# Patient Record
Sex: Female | Born: 1970
Health system: Southern US, Community
[De-identification: ages and names within clinical notes are randomized; demographics above are authoritative.]

---

## 2018-11-15 ENCOUNTER — Ambulatory Visit: Payer: Self-pay | Admitting: Family Medicine

## 2018-11-15 ENCOUNTER — Ambulatory Visit (INDEPENDENT_AMBULATORY_CARE_PROVIDER_SITE_OTHER): Payer: BLUE CROSS/BLUE SHIELD | Admitting: Family Medicine

## 2018-11-15 ENCOUNTER — Ambulatory Visit: Payer: Self-pay

## 2018-11-15 ENCOUNTER — Encounter: Payer: Self-pay | Admitting: Family Medicine

## 2018-11-15 VITALS — BP 122/90 | HR 74 | Ht 66.0 in

## 2018-11-15 DIAGNOSIS — M25511 Pain in right shoulder: Secondary | ICD-10-CM

## 2018-11-15 DIAGNOSIS — G8929 Other chronic pain: Secondary | ICD-10-CM | POA: Diagnosis not present

## 2018-11-15 DIAGNOSIS — M75101 Unspecified rotator cuff tear or rupture of right shoulder, not specified as traumatic: Secondary | ICD-10-CM | POA: Insufficient documentation

## 2018-11-15 MED ORDER — NITROGLYCERIN 0.2 MG/HR TD PT24: MEDICATED_PATCH | TRANSDERMAL | 1 refills | Status: AC

## 2018-11-15 MED ORDER — DICLOFENAC SODIUM 2 % TD SOLN: 2.0000 g | Freq: Two times a day (BID) | TRANSDERMAL | 3 refills | Status: AC

## 2018-11-15 MED ORDER — VITAMIN D (ERGOCALCIFEROL) 1.25 MG (50000 UNIT) PO CAPS: 50000.0000 [IU] | ORAL_CAPSULE | ORAL | 0 refills | Status: AC

## 2018-11-15 NOTE — Assessment & Plan Note (Signed)
Rotator cuff tear, partial, there is a possibility for also an frozen shoulder.  I discussed with patient in great length.  I do not believe that this is cervical radiculopathy.  Nitroglycerin and discussed potential side effects.  Discussed once weekly vitamin D, icing regimen, topical anti-inflammatories.  Follow-up again 4 weeks.  Patient was to give injection if worsening and consider physical therapy.

## 2018-11-15 NOTE — Progress Notes (Deleted)
[name] [address] [place], [place] [address] Phone: [phone] Subjective:    I'm seeing this patient by the request  of:    CC:   [id number]  Sandy Jordan is a 48 y.o. female coming in with complaint of ***  Onset-  Location Duration-  Character- Aggravating factors- Reliving factors-  Therapies tried-  Severity-     No past medical history on file. *** The histories are not reviewed yet. Please review them in the "History" navigator section and refresh this SmartLink. Social History   Socioeconomic History  . Marital status: Married    Spouse name: Not on file  . Number of children: Not on file  . Years of education: Not on file  . Highest education level: Not on file  Occupational History  . Not on file  Social Needs  . Financial resource strain: Not on file  . Food insecurity:    Worry: Not on file    Inability: Not on file  . Transportation needs:    Medical: Not on file    Non-medical: Not on file  Tobacco Use  . Smoking status: Not on file  Substance and Sexual Activity  . Alcohol use: Not on file  . Drug use: Not on file  . Sexual activity: Not on file  Lifestyle  . Physical activity:    Days per week: Not on file    Minutes per session: Not on file  . Stress: Not on file  Relationships  . Social connections:    Talks on phone: Not on file    Gets together: Not on file    Attends religious service: Not on file    Active member of club or organization: Not on file    Attends meetings of clubs or organizations: Not on file    Relationship status: Not on file  Other Topics Concern  . Not on file  Social History Narrative  . Not on file   Allergies not on file No family history on file. No current outpatient medications on file.    Past medical history, social, surgical and family history all reviewed in electronic medical record.  No pertanent information unless stated regarding to the chief  complaint.   Review of Systems:  No headache, visual changes, nausea, vomiting, diarrhea, constipation, dizziness, abdominal pain, skin rash, fevers, chills, night sweats, weight loss, swollen lymph nodes, body aches, joint swelling, muscle aches, chest pain, shortness of breath, mood changes.   Objective  There were no vitals taken for this visit. Systems examined below as of    General: No apparent distress alert and oriented x3 mood and affect normal, dressed appropriately.  HEENT: Pupils equal, extraocular movements intact  Respiratory: Patient's speak in full sentences and does not appear short of breath  Cardiovascular: No lower extremity edema, non tender, no erythema  Skin: Warm dry intact with no signs of infection or rash on extremities or on axial skeleton.  Abdomen: Soft nontender  Neuro: Cranial nerves II through XII are intact, neurovascularly intact in all extremities with 2+ DTRs and 2+ pulses.  Lymph: No lymphadenopathy of posterior or anterior cervical chain or axillae bilaterally.  Gait normal with good balance and coordination.  MSK:  Non tender with full range of motion and good stability and symmetric strength and tone of  elbows, wrist, hip, knee and ankles bilaterally.  Shoulder: Right Inspection reveals no abnormalities, atrophy or asymmetry. Palpation is normal with no tenderness over  AC joint or bicipital groove. ROM is full in all planes passively. Rotator cuff strength normal throughout. signs of impingement with positive Neer and Hawkin's tests, but negative empty can sign. Speeds and Yergason's tests normal. No labral pathology noted with negative Obrien's, negative clunk and good stability. Normal scapular function observed. No painful arc and no drop arm sign. No apprehension sign  MSK US performed of: Right This study was ordered, performed, and interpreted by Terrilee Files D.O.  Shoulder:   Supraspinatus:  Appears normal on long and transverse views,  Bursal bulge seen with shoulder abduction on impingement view. Infraspinatus:  Appears normal on long and transverse views. Significant increase in Doppler flow Subscapularis:  Appears normal on long and transverse views. Positive bursa Teres Minor:  Appears normal on long and transverse views. AC joint:  Capsule undistended, no geyser sign. Glenohumeral Joint:  Appears normal without effusion. Glenoid Labrum:  Intact without visualized tears. Biceps Tendon:  Appears normal on long and transverse views, no fraying of tendon, tendon located in intertubercular groove, no subluxation with shoulder internal or external rotation.  Impression: Subacromial bursitis  Procedure: Real-time Ultrasound Guided Injection of right glenohumeral joint Device: GE Logiq E  Ultrasound guided injection is preferred based studies that show increased duration, increased effect, greater accuracy, decreased procedural pain, increased response rate with ultrasound guided versus blind injection.  Verbal informed consent obtained.  Time-out conducted.  Noted no overlying erythema, induration, or other signs of local infection.  Skin prepped in a sterile fashion.  Local anesthesia: Topical Ethyl chloride.  With sterile technique and under real time ultrasound guidance:  Joint visualized.  23g 1  inch needle inserted posterior approach. Pictures taken for needle placement. Patient did have injection of 2 cc of 1% lidocaine, 2 cc of 0.5% Marcaine, and 1.0 cc of Kenalog 40 mg/dL. Completed without difficulty  Pain immediately resolved suggesting accurate placement of the medication.  Advised to call if fevers/chills, erythema, induration, drainage, or persistent bleeding.  Images permanently stored and available for review in the ultrasound unit.  Impression: Technically successful ultrasound guided injection.    Impression and Recommendations:     This case required medical decision making of moderate  complexity. The above documentation has been reviewed and is accurate and complete Judi Saa, DO       Note: This dictation was prepared with Dragon dictation along with smaller phrase technology. Any transcriptional errors that result from this process are unintentional.

## 2018-11-15 NOTE — Patient Instructions (Addendum)
Good to see you.  Ice 20 minutes 2 times daily. Usually after activity and before bed. pennsaid pinkie amount topically 2 times daily as needed.  Nitroglycerin Protocol   Apply 1/4 nitroglycerin patch to affected area daily.  Change position of patch within the affected area every 24 hours.  You may experience a headache during the first 1-2 weeks of using the patch, these should subside.  If you experience headaches after beginning nitroglycerin patch treatment, you may take your preferred over the counter pain reliever.  Another side effect of the nitroglycerin patch is skin irritation or rash related to patch adhesive.  Please notify our office if you develop more severe headaches or rash, and stop the patch.  Tendon healing with nitroglycerin patch may require 12 to 24 weeks depending on the extent of injury.  Men should not use if taking Viagra, Cialis, or Levitra.   Do not use if you have migraines or rosacea.  Once weekly vitamin D for 12 weeks Avoid heavy lifting and try to keep hands within peripheral vision   See me again in 3-5 weeks and if not better we will discuss injections or PT

## 2018-11-15 NOTE — Progress Notes (Signed)
[facility] [address] [place], [place] [address] Phone: [phone] Subjective:    Scribed by Wilford Grist  CC: right shoulder pain   HUT:[id number]  Sandy Jordan is a 48 y.o. female coming in with complaint of right shoulder pain for 4 months ago. Believes she may have injured it doing [employer]. Has taken a couple weeks off. Pain has gotten worse. Pain with side planks and tricep work. Pain is over supraspinatus but can go into the right arm. Pain is achy in character. Has used heat and Advil.  Does not remember any specific     History reviewed. No pertinent past medical history. History reviewed. No pertinent surgical history. Social History   Socioeconomic History  . Marital status: Divorced    Spouse name: Not on file  . Number of children: Not on file  . Years of education: Not on file  . Highest education level: Not on file  Occupational History  . Not on file  Social Needs  . Financial resource strain: Not on file  . Food insecurity:    Worry: Not on file    Inability: Not on file  . Transportation needs:    Medical: Not on file    Non-medical: Not on file  Tobacco Use  . Smoking status: Not on file  Substance and Sexual Activity  . Alcohol use: Not on file  . Drug use: Not on file  . Sexual activity: Not on file  Lifestyle  . Physical activity:    Days per week: Not on file    Minutes per session: Not on file  . Stress: Not on file  Relationships  . Social connections:    Talks on phone: Not on file    Gets together: Not on file    Attends religious service: Not on file    Active member of club or organization: Not on file    Attends meetings of clubs or organizations: Not on file    Relationship status: Not on file  Other Topics Concern  . Not on file  Social History Narrative  . Not on file   Not on File History reviewed. No pertinent family history.   Current Outpatient Medications (Cardiovascular):    .  nitroGLYCERIN (NITRODUR - DOSED IN MG/24 HR) 0.2 mg/hr patch, 1/4 patch daily     Current Outpatient Medications (Other):  [name]  Diclofenac Sodium 2 % SOLN, Place 2 g onto the skin 2 (two) times daily. .  Vitamin D, Ergocalciferol, (DRISDOL) 1.25 MG (50000 UT) CAPS capsule, Take 1 capsule (50,000 Units total) by mouth every 7 (seven) days.    Past medical history, social, surgical and family history all reviewed in electronic medical record.  No pertanent information unless stated regarding to the chief complaint.   Review of Systems:  No headache, visual changes, nausea, vomiting, diarrhea, constipation, dizziness, abdominal pain, skin rash, fevers, chills, night sweats, weight loss, swollen lymph nodes, body aches, joint swelling,  chest pain, shortness of breath, mood changes. Positive muscle aches   Objective  Blood pressure 122/90, pulse 74, height 5\' 6"  (1.676 m), SpO2 98 %.    General: No apparent distress alert and oriented x3 mood and affect normal, dressed appropriately.  HEENT: Pupils equal, extraocular movements intact  Respiratory: Patient's speak in full sentences and does not appear short of breath  Cardiovascular: No lower extremity edema, non tender, no erythema  Skin: Warm dry intact with no signs of infection or  rash on extremities or on axial skeleton.  Abdomen: Soft nontender  Neuro: Cranial nerves II through XII are intact, neurovascularly intact in all extremities with 2+ DTRs and 2+ pulses.  Lymph: No lymphadenopathy of posterior or anterior cervical chain or axillae bilaterally.  Gait normal with good balance and coordination.  MSK:  Non tender with full range of motion and good stability and symmetric strength and tone of  elbows, wrist, hip, knee and ankles bilaterally.  \ Shoulder: Right Inspection reveals no abnormalities, atrophy or asymmetry. Palpation is normal with no tenderness over AC joint or bicipital groove. ROM is decreased in all planes.   Forward flexion of 170 degrees, external rotation 75 degrees, internal rotation to sacrum. Rotator cuff strength 5-5 strength Positive impingement Speeds and Yergason's tests normal. Mild positive O'Brien's Normal scapular function observed. No painful arc and no drop arm sign. No apprehension sign Contralateral shoulder unremarkable  MSK US performed of: *Right shoulder This study was ordered, performed, and interpreted by Terrilee Files D.O.  Shoulder:   Supraspinatus: Patient does have some what appears to be articular sided tearing but no significant retraction noted.  Mild increase in hypoechoic changes as well as Doppler flow continue the anterior capsule noted Infraspinatus:  Appears normal on long and transverse views. Subscapularis:  Appears normal on long and transverse views. AC joint:  Capsule undistended, no geyser sign. Glenohumeral Joint:  Appears normal without effusion. Glenoid Labrum:  Intact without visualized tears. Biceps Tendon:  Appears normal on long and transverse views, no fraying of tendon, tendon located in intertubercular groove, no subluxation with shoulder internal or external rotation. No increased power doppler signal. Impression: Rotator cuff tear and partial as well as some thickening of the capsule  [id number]; 15 additional minutes spent for Therapeutic exercises as stated in above notes.  This included exercises focusing on stretching, strengthening, with significant focus on eccentric aspects.   Long term goals include an improvement in range of motion, strength, endurance as well as avoiding reinjury. Patient's frequency would include in 1-2 times a day, 3-5 times a week for a duration of 6-12 weeks. Shoulder Exercises that included:  Basic scapular stabilization to include adduction and depression of scapula Scaption, focusing on proper movement and good control Internal and External rotation utilizing a theraband, with elbow tucked at side entire time Rows  with theraband which was given   Proper technique shown and discussed handout in great detail with ATC.  All questions were discussed and answered.      Impression and Recommendations:     This case required medical decision making of moderate complexity. The above documentation has been reviewed and is accurate and complete Judi Saa, DO       Note: This dictation was prepared with Dragon dictation along with smaller phrase technology. Any transcriptional errors that result from this process are unintentional.

## 2018-12-13 ENCOUNTER — Ambulatory Visit (INDEPENDENT_AMBULATORY_CARE_PROVIDER_SITE_OTHER): Payer: BLUE CROSS/BLUE SHIELD | Admitting: Family Medicine

## 2018-12-13 ENCOUNTER — Encounter: Payer: Self-pay | Admitting: Family Medicine

## 2018-12-13 ENCOUNTER — Other Ambulatory Visit: Payer: Self-pay

## 2018-12-13 ENCOUNTER — Ambulatory Visit: Payer: Self-pay

## 2018-12-13 VITALS — BP 122/82 | HR 77 | Ht 66.0 in

## 2018-12-13 DIAGNOSIS — G8929 Other chronic pain: Secondary | ICD-10-CM

## 2018-12-13 DIAGNOSIS — M25511 Pain in right shoulder: Secondary | ICD-10-CM

## 2018-12-13 DIAGNOSIS — M7501 Adhesive capsulitis of right shoulder: Secondary | ICD-10-CM

## 2018-12-13 DIAGNOSIS — M7500 Adhesive capsulitis of unspecified shoulder: Secondary | ICD-10-CM | POA: Insufficient documentation

## 2018-12-13 NOTE — Assessment & Plan Note (Signed)
Patient given injection and tolerated the procedure well.  Discussed icing regimen and home exercise.  Discussed which activities to do which wants to avoid.  Increase activity slowly over the course of next several days.  Follow-up again 6 weeks.  May need repeat injection again.  If no significant improvement may need to consider advanced imaging.

## 2018-12-13 NOTE — Progress Notes (Signed)
[facility] [address] [place], [place] [address] Phone: [phone] Subjective:   Sandy Jordan, am serving as a scribe for Dr. Antoine Primas.   CC: Shoulder pain follow-up  XQJ:[id number]   11/16/2018: Rotator cuff tear, partial, there is a possibility for also an frozen shoulder.  I discussed with patient in great length.  I do not believe that this is cervical radiculopathy.  Nitroglycerin and discussed potential side effects.  Discussed once weekly vitamin D, icing regimen, topical anti-inflammatories.  Follow-up again 4 weeks.  Patient was to give injection if worsening and consider physical therapy.  Update 12/13/2018: Sandy Jordan is a 48 y.o. female coming in with complaint of right shoulder pain. Patient states that she is no better than last visit. Continues to have catching sensation which produces sharp pain. Has been using nitro patches and vitamin d.        No past medical history on file. No past surgical history on file. Social History   Socioeconomic History  . Marital status: Divorced    Spouse name: Not on file  . Number of children: Not on file  . Years of education: Not on file  . Highest education level: Not on file  Occupational History  . Not on file  Social Needs  . Financial resource strain: Not on file  . Food insecurity:    Worry: Not on file    Inability: Not on file  . Transportation needs:    Medical: Not on file    Non-medical: Not on file  Tobacco Use  . Smoking status: Not on file  Substance and Sexual Activity  . Alcohol use: Not on file  . Drug use: Not on file  . Sexual activity: Not on file  Lifestyle  . Physical activity:    Days per week: Not on file    Minutes per session: Not on file  . Stress: Not on file  Relationships  . Social connections:    Talks on phone: Not on file    Gets together: Not on file    Attends religious service: Not on file    Active member of club or  organization: Not on file    Attends meetings of clubs or organizations: Not on file    Relationship status: Not on file  Other Topics Concern  . Not on file  Social History Narrative  . Not on file   Not on File No family history on file.   Current Outpatient Medications (Cardiovascular):  .  nitroGLYCERIN (NITRODUR - DOSED IN MG/24 HR) 0.2 mg/hr patch, 1/4 patch daily     Current Outpatient Medications (Other):  [name]  Diclofenac Sodium 2 % SOLN, Place 2 g onto the skin 2 (two) times daily. .  Vitamin D, Ergocalciferol, (DRISDOL) 1.25 MG (50000 UT) CAPS capsule, Take 1 capsule (50,000 Units total) by mouth every 7 (seven) days.    Past medical history, social, surgical and family history all reviewed in electronic medical record.  No pertanent information unless stated regarding to the chief complaint.   Review of Systems:  No headache, visual changes, nausea, vomiting, diarrhea, constipation, dizziness, abdominal pain, skin rash, fevers, chills, night sweats, weight loss, swollen lymph nodes, body aches, joint swelling, chest pain, shortness of breath, mood changes.  Positive muscle aches  Objective  Blood pressure 122/82, pulse 77, height 5\' 6"  (1.676 m), SpO2 99 %.    General: No apparent distress alert and oriented x3 mood and  affect normal, dressed appropriately.  HEENT: Pupils equal, extraocular movements intact  Respiratory: Patient's speak in full sentences and does not appear short of breath  Cardiovascular: No lower extremity edema, non tender, no erythema  Skin: Warm dry intact with no signs of infection or rash on extremities or on axial skeleton.  Abdomen: Soft nontender  Neuro: Cranial nerves II through XII are intact, neurovascularly intact in all extremities with 2+ DTRs and 2+ pulses.  Lymph: No lymphadenopathy of posterior or anterior cervical chain or axillae bilaterally.  Gait normal with good balance and coordination.  MSK:  Non tender with full range of  motion and good stability and symmetric strength and tone of  elbows, wrist, hip, knee and ankles bilaterally.  Vaginal exam has decreased range of motion in all planes.  Patient does have some mild crepitus noted.  Patient does have some tenderness to palpation.  Patient has been only forward flexion of 120 degrees, external rotation of 5 degrees and internal rotation to lateral hip Contralateral shoulder unremarkable  Procedure: Real-time Ultrasound Guided Injection of right glenohumeral joint Device: GE Logiq Q7  Ultrasound guided injection is preferred based studies that show increased duration, increased effect, greater accuracy, decreased procedural pain, increased response rate with ultrasound guided versus blind injection.  Verbal informed consent obtained.  Time-out conducted.  Noted no overlying erythema, induration, or other signs of local infection.  Skin prepped in a sterile fashion.  Local anesthesia: Topical Ethyl chloride.  With sterile technique and under real time ultrasound guidance:  Joint visualized.  23g 1  inch needle inserted posterior approach. Pictures taken for needle placement. Patient did have injection of 2 cc of 1% lidocaine, 2 cc of 0.5% Marcaine, and 1.0 cc of Kenalog 40 mg/dL. Completed without difficulty  Pain immediately resolved suggesting accurate placement of the medication.  Advised to call if fevers/chills, erythema, induration, drainage, or persistent bleeding.  Images permanently stored and available for review in the ultrasound unit.  Impression: Technically successful ultrasound guided injection.    Impression and Recommendations:     This case required medical decision making of moderate complexity. The above documentation has been reviewed and is accurate and complete Judi Saa, DO       Note: This dictation was prepared with Dragon dictation along with smaller phrase technology. Any transcriptional errors that result from this  process are unintentional.

## 2018-12-13 NOTE — Patient Instructions (Addendum)
Good to see you  Ice is your friend Keep ding the exercises  See me again in 6ish weeks and may need to repeat the injection  Stay safe

## 2019-01-24 ENCOUNTER — Ambulatory Visit (INDEPENDENT_AMBULATORY_CARE_PROVIDER_SITE_OTHER): Payer: BLUE CROSS/BLUE SHIELD | Admitting: Family Medicine

## 2019-01-24 ENCOUNTER — Encounter: Payer: Self-pay | Admitting: Family Medicine

## 2019-01-24 ENCOUNTER — Other Ambulatory Visit: Payer: Self-pay

## 2019-01-24 ENCOUNTER — Ambulatory Visit (INDEPENDENT_AMBULATORY_CARE_PROVIDER_SITE_OTHER)
Admission: RE | Admit: 2019-01-24 | Discharge: 2019-01-24 | Disposition: A | Discharge status: Home / Self Care | Payer: BLUE CROSS/BLUE SHIELD | Source: Ambulatory Visit | Attending: Family Medicine | Admitting: Family Medicine

## 2019-01-24 ENCOUNTER — Ambulatory Visit: Payer: Self-pay

## 2019-01-24 VITALS — BP 136/80 | HR 87 | Ht 66.0 in | Wt 160.0 lb

## 2019-01-24 DIAGNOSIS — G8929 Other chronic pain: Secondary | ICD-10-CM | POA: Diagnosis not present

## 2019-01-24 DIAGNOSIS — M25511 Pain in right shoulder: Secondary | ICD-10-CM | POA: Diagnosis not present

## 2019-01-24 DIAGNOSIS — M7501 Adhesive capsulitis of right shoulder: Secondary | ICD-10-CM | POA: Diagnosis not present

## 2019-01-24 MED ORDER — GABAPENTIN 100 MG PO CAPS: 200.0000 mg | ORAL_CAPSULE | Freq: Every day | ORAL | 3 refills | Status: AC

## 2019-01-24 NOTE — Assessment & Plan Note (Signed)
Still believe likely secondary to frozen shoulder.  6 weeks after the injection.  Pain is better controlled but no improvement in range of motion.  We will start formal physical therapy that I think will be beneficial.  X-rays ordered today to further evaluate secondary to patient's increasing muscle wasting recently.  Patient continued to have problems I would consider a MRI sooner than later.  Continue all other medications

## 2019-01-24 NOTE — Patient Instructions (Addendum)
Good to see you  Xray downstairs  Ice is your friend Keep working on the range of motion  PT will call you  Gabapentin at night See me again in 4ish weeks

## 2019-01-24 NOTE — Progress Notes (Signed)
[name] [address] [place], [place] [address] Phone: [phone] Subjective:   I [name] am serving as a Neurosurgeon for Dr. Antoine Primas.  I'm seeing this patient by the request  of:    CC:   [id number]   12/13/2018 Patient given injection and tolerated the procedure well.  Discussed icing regimen and home exercise.  Discussed which activities to do which wants to avoid.  Increase activity slowly over the course of next several days.  Follow-up again 6 weeks.  May need repeat injection again.  If no significant improvement may need to consider advanced imaging.  01/24/2019 Sandy Jordan is a 48 y.o. female coming in with complaint of shoulder pain. States that she feels about the same.  Patient had a small partial tear of the rotator cuff and then unfortunately did have what appeared to be frozen shoulder.  Patient has stated that she has noticed a little bit of increase in weakness.  States that the pain is though fairly controlled.  Patient is more concerned with loss of motion has not made any significant improvement at this time.    No past medical history on file. No past surgical history on file. Social History   Socioeconomic History  . Marital status: Divorced    Spouse name: Not on file  . Number of children: Not on file  . Years of education: Not on file  . Highest education level: Not on file  Occupational History  . Not on file  Social Needs  . Financial resource strain: Not on file  . Food insecurity:    Worry: Not on file    Inability: Not on file  . Transportation needs:    Medical: Not on file    Non-medical: Not on file  Tobacco Use  . Smoking status: Not on file  Substance and Sexual Activity  . Alcohol use: Not on file  . Drug use: Not on file  . Sexual activity: Not on file  Lifestyle  . Physical activity:    Days per week: Not on file    Minutes per session: Not on file  . Stress: Not on file   Relationships  . Social connections:    Talks on phone: Not on file    Gets together: Not on file    Attends religious service: Not on file    Active member of club or organization: Not on file    Attends meetings of clubs or organizations: Not on file    Relationship status: Not on file  Other Topics Concern  . Not on file  Social History Narrative  . Not on file   Not on File No family history on file.   Current Outpatient Medications (Cardiovascular):  .  nitroGLYCERIN (NITRODUR - DOSED IN MG/24 HR) 0.2 mg/hr patch, 1/4 patch daily     Current Outpatient Medications (Other):  [name]  Diclofenac Sodium 2 % SOLN, Place 2 g onto the skin 2 (two) times daily. .  Vitamin D, Ergocalciferol, (DRISDOL) 1.25 MG (50000 UT) CAPS capsule, Take 1 capsule (50,000 Units total) by mouth every 7 (seven) days. [name]  gabapentin (NEURONTIN) 100 MG capsule, Take 2 capsules (200 mg total) by mouth at bedtime.    Past medical history, social, surgical and family history all reviewed in electronic medical record.  No pertanent information unless stated regarding to the chief complaint.   Review of Systems:  No headache, visual changes, nausea, vomiting, diarrhea, constipation, dizziness,  abdominal pain, skin rash, fevers, chills, night sweats, weight loss, swollen lymph nodes, body aches, joint swelling, , chest pain, shortness of breath, mood changes.  Positive muscle aches  Objective  Blood pressure 136/80, pulse 87, height 5\' 6"  (1.676 m), weight 160 lb (72.6 kg), last menstrual period 01/02/2019, SpO2 98 %.    General: No apparent distress alert and oriented x3 mood and affect normal, dressed appropriately.  HEENT: Pupils equal, extraocular movements intact  Respiratory: Patient's speak in full sentences and does not appear short of breath  Cardiovascular: No lower extremity edema, non tender, no erythema  Skin: Warm dry intact with no signs of infection or rash on extremities or on axial  skeleton.  Abdomen: Soft nontender  Neuro: Cranial nerves II through XII are intact, neurovascularly intact in all extremities with 2+ DTRs and 2+ pulses.  Lymph: No lymphadenopathy of posterior or anterior cervical chain or axillae bilaterally.  Gait normal with good balance and coordination.  MSK:  Non tender with full range of motion and good stability and symmetric strength and tone of  elbows, wrist, hip, knee and ankles bilaterally.  Right shoulder exam shows no significant loss of range of motion.  Patient's inspection also shows the patient does have atrophy of the shoulder.  Patient has only forward flexion of 85 degrees, external rotation of 5 degrees, internal rotation to sacrum.  Rotator cuff strength 4 out of 5 compared to contralateral side  Limited musculoskeletal ultrasound was performed and interpreted by Judi SaaZachary M Lavita Pontius  Limited ultrasound shows the patient does have mild arthritic changes and patient does have significant inflammation of the anterior capsule noted.  no true rotator cuff tear noted at this time    Impression and Recommendations:     This case required medical decision making of moderate complexity. The above documentation has been reviewed and is accurate and complete Judi SaaZachary M Tylor Courtwright, DO       Note: This dictation was prepared with Dragon dictation along with smaller phrase technology. Any transcriptional errors that result from this process are unintentional.

## 2019-01-30 ENCOUNTER — Other Ambulatory Visit: Payer: Self-pay

## 2019-01-30 ENCOUNTER — Ambulatory Visit (INDEPENDENT_AMBULATORY_CARE_PROVIDER_SITE_OTHER): Payer: BLUE CROSS/BLUE SHIELD | Admitting: Physical Therapy

## 2019-01-30 DIAGNOSIS — M25611 Stiffness of right shoulder, not elsewhere classified: Secondary | ICD-10-CM | POA: Diagnosis not present

## 2019-01-30 DIAGNOSIS — R293 Abnormal posture: Secondary | ICD-10-CM | POA: Diagnosis not present

## 2019-01-30 DIAGNOSIS — M6281 Muscle weakness (generalized): Secondary | ICD-10-CM

## 2019-01-30 DIAGNOSIS — M25511 Pain in right shoulder: Secondary | ICD-10-CM

## 2019-01-30 NOTE — Therapy (Signed)
[facility] [address] [place], [place], [address] Phone: [phone]   Fax:  [phone]  Physical Therapy Evaluation  Patient Details  Name: Sandy Jordan MRN: [id number] Date of Birth: 09-20-1970 Referring Provider (PT): Dr. Terrilee Files   Encounter Date: 01/30/2019  PT End of Session - 01/30/19 1541    Visit Number  1    Number of Visits  12    Date for PT Re-Evaluation  03/13/19    PT Start Time  1453    PT Stop Time  1530    PT Time Calculation (min)  37 min    Activity Tolerance  Patient tolerated treatment well    Behavior During Therapy  [facility] for tasks assessed/performed       History reviewed. No pertinent past medical history.  History reviewed. No pertinent surgical history.  There were no vitals filed for this visit.   Subjective Assessment - 01/30/19 1455    Subjective  Pt is a 48 y/o female who presents to OPPT for Rt shoulder pain x 6 months.  Pt reports pain has gotten progressively worse, and no known cause or injury.      Limitations  Lifting    Diagnostic tests  xrays: normal    Patient Stated Goals  improve shoulder mobility and pain    Currently in Pain?  Yes    Pain Score  3    up to 10/10, occasional 0/10   Pain Location  Shoulder    Pain Orientation  Right    Pain Descriptors / Indicators  Tiring;Dull;Aching;Sharp   muscle fatigue   Pain Type  Acute pain;Chronic pain    Pain Radiating Towards  RUE    Pain Onset  More than a month ago    Pain Frequency  Intermittent    Aggravating Factors   forward reaching, UB dressing    Pain Relieving Factors  stretching, meds, heat/ice         [facility] PT Assessment - 01/30/19 1459      Assessment   Medical Diagnosis  Rt shoulder pain    Referring Provider (PT)  Dr. Terrilee Files    Onset Date/Surgical Date  --   Jan 2020   Hand Dominance  Right    Next MD Visit  02/25/2019    Prior Therapy  none      Precautions   Precautions  None      Restrictions   Weight Bearing Restrictions  No      Balance Screen   Has the patient fallen in the past 6 months  No    Has the patient had a decrease in activity level because of a fear of falling?   No    Is the patient reluctant to leave their home because of a fear of falling?   No      Home Public house manager residence    Additional Comments  modified with UB dressing      Prior Function   Level of Independence  Independent    Vocation  Full time employment    Vocation Requirements  CPA/accounting: seated computer work    Leisure  running/walking 2-3x/wk; was at [employer] prior to [employer] - pain started there      Cognition   Overall Cognitive Status  Within Functional Limits for tasks assessed      Posture/Postural Control   Posture/Postural Control  Postural limitations    Postural Limitations  Rounded Shoulders;Forward  head      ROM / Strength   AROM / PROM / Strength  AROM;PROM;Strength      AROM   Overall AROM Comments  flex/abdct tested in sitting    AROM Assessment Site  Shoulder    Right/Left Shoulder  Right;Left    Right Shoulder Flexion  83 Degrees    Right Shoulder ABduction  75 Degrees    Right Shoulder Internal Rotation  --   FIR to Rt glute   Right Shoulder External Rotation  29 Degrees   FER difficult to Rt ear   Left Shoulder Flexion  167 Degrees    Left Shoulder ABduction  169 Degrees      PROM   PROM Assessment Site  Shoulder    Right/Left Shoulder  Right    Right Shoulder Flexion  99 Degrees    Right Shoulder ABduction  95 Degrees      Strength   Overall Strength Comments  Rt shoulder grossly 3-/5      Palpation   Palpation comment  pt tender to light palpation Rt shoulder                Objective measurements completed on examination: See above findings.      [facility] - 01/30/19 1459      Exercises   Exercises  Shoulder      Shoulder Exercises: Supine   External Rotation   AAROM;Right;5 reps   cane   Flexion  AAROM;Right;5 reps   cane     Shoulder Exercises: Standing   Internal Rotation  AAROM;Right;5 reps   cane   ABduction  AAROM;Right;5 reps   scaption     Shoulder Exercises: Pulleys   Flexion  2 minutes             PT Education - 01/30/19 1540    Education Details  HEP, pulleys    Person(s) Educated  Patient    Methods  Explanation;Demonstration;Handout    Comprehension  Returned demonstration;Verbalized understanding;Need further instruction          PT Long Term Goals - 01/30/19 1545      PT LONG TERM GOAL #1   Title  independent with HEP    Status  New    Target Date  03/13/19      PT LONG TERM GOAL #2   Title  Rt shoulder flexion and abduction AROM improved at least 15 degrees for improved function    Status  New    Target Date  03/13/19      PT LONG TERM GOAL #3   Title  report pain < 4/10 with activity and reaching for improved function    Status  New    Target Date  03/13/19      PT LONG TERM GOAL #4   Title  demonstrate Rt shoulder functional internal rotation to at least L1 for improved function and greater ease with dressing    Status  New    Target Date  03/13/19             Plan - 01/30/19 1541    Clinical Impression Statement  Pt is a 48 y/o female who presents to OPPT for Rt adhesive capsulitis.  Pt demonstrates postural abnormalities, decreased ROM and strength affecting functional mobility.  Pt will benefit from PT to address deficits listed.    Examination-Activity Limitations  Bathing;Hygiene/Grooming;Reach Overhead;Toileting;Dressing    Examination-Participation Restrictions  Cleaning;Meal Prep;Other   work   Company secretarytability/Clinical Decision  Making  Evolving/Moderate complexity    Clinical Decision Making  Low    Rehab Potential  Good    PT Frequency  2x / week    PT Duration  6 weeks    PT Treatment/Interventions  ADLs/Self Care Home Management;Cryotherapy;Ultrasound;Moist Heat;Iontophoresis  $RemoveBef xamethasone;Electrical Stimulation;Therapeutic exercise;Therapeutic activities;Patient/family education;Manual techniques;Dry needling;Passive range of motion;Taping    PT Next Visit Plan  review cane exercises; pulleys; add posture exercises, manual therapy    PT Home Exercise Plan  Access Code: [email or web address]    Consulted and Agree with Plan of Care  Patient       Patient will benefit from skilled therapeutic intervention in order to improve the following deficits and impairments:  Increased fascial restricitons, Pain, Postural dysfunction, Decreased strength, Decreased range of motion, Hypomobility  Visit Diagnosis: Stiffness of right shoulder, not elsewhere classified - Plan: PT plan of care cert/re-cert  Acute pain of right shoulder - Plan: PT plan of care cert/re-cert  Abnormal posture - Plan: PT plan of care cert/re-cert  Muscle weakness (generalized) - Plan: PT plan of care cert/re-cert     Problem List Patient Active Problem List   Diagnosis Date Noted  . Frozen shoulder 12/13/2018  . Rotator cuff tear, right 11/15/2018      Clarita Crane, PT, DPT 01/30/19 3:49 PM     [facility] [address] [place], [place], [address] Phone: [phone]   Fax:  [phone]  Name: Sandy Jordan MRN: [id number] Date of Birth: 1970/10/04

## 2019-01-30 NOTE — Patient Instructions (Signed)
Access Code: [email or web address]  URL: [email or web address]  Date: 01/30/2019  Prepared by: Moshe Cipro   Exercises  Supine Shoulder Flexion with Dowel - 10 reps - 1 sets - 2-3 sec hold - 2x daily - 7x weekly  Supine Shoulder External Rotation with Dowel - 10 reps - 1 sets - 2-3 sec hold - 2x daily - 7x weekly  Standing Bilateral Shoulder Internal Rotation AAROM with Dowel - 10 reps - 1 sets - 2-3 sec hold - 2x daily - 7x weekly  Shoulder Scaption AAROM with Dowel - 10 reps - 1 sets - 2-3 sec hold - 2x daily - 7x weekly

## 2019-02-06 ENCOUNTER — Other Ambulatory Visit: Payer: Self-pay

## 2019-02-06 ENCOUNTER — Ambulatory Visit (INDEPENDENT_AMBULATORY_CARE_PROVIDER_SITE_OTHER): Payer: BLUE CROSS/BLUE SHIELD | Admitting: Physical Therapy

## 2019-02-06 DIAGNOSIS — M25511 Pain in right shoulder: Secondary | ICD-10-CM | POA: Diagnosis not present

## 2019-02-06 DIAGNOSIS — M25611 Stiffness of right shoulder, not elsewhere classified: Secondary | ICD-10-CM

## 2019-02-06 DIAGNOSIS — R293 Abnormal posture: Secondary | ICD-10-CM | POA: Diagnosis not present

## 2019-02-06 DIAGNOSIS — M6281 Muscle weakness (generalized): Secondary | ICD-10-CM | POA: Diagnosis not present

## 2019-02-06 NOTE — Therapy (Signed)
[facility] [address] [place], [place], [address] Phone: [phone]   Fax:  [phone]  Physical Therapy Treatment  Patient Details  Name: Sandy Jordan MRN: [id number] Date of Birth: 04-26-1971 Referring Provider (PT): Dr. Charlann Boxer   Encounter Date: 02/06/2019  PT End of Session - 02/06/19 1551    Visit Number  2    Number of Visits  12    Date for PT Re-Evaluation  03/13/19    PT Start Time  1503    PT Stop Time  1546    PT Time Calculation (min)  43 min    Activity Tolerance  Patient tolerated treatment well    Behavior During Therapy  [facility] for tasks assessed/performed       History reviewed. No pertinent past medical history.  History reviewed. No pertinent surgical history.  There were no vitals filed for this visit.  Subjective Assessment - 02/06/19 1506    Subjective  has some good days and other days when shoulder is still very tight.  pain with movement, at rest pain is okay.    Patient Stated Goals  improve shoulder mobility and pain    Currently in Pain?  Yes    Pain Score  2     Pain Location  Shoulder    Pain Orientation  Right    Pain Descriptors / Indicators  Dull;Aching    Pain Type  Acute pain;Chronic pain    Pain Onset  More than a month ago    Pain Frequency  Intermittent    Aggravating Factors   forward reaching, UB dressing    Pain Relieving Factors  stretching, meds, heat/ice         [facility] PT Assessment - 02/06/19 1530      Assessment   Medical Diagnosis  Rt shoulder pain    Referring Provider (PT)  Dr. Charlann Boxer      AROM   Right Shoulder Flexion  107 Degrees    Right Shoulder External Rotation  27 Degrees                   [facility] Adult PT Treatment/Exercise - 02/06/19 1508      Shoulder Exercises: Supine   External Rotation  AAROM;Right;10 reps   cane   Flexion  AAROM;Right;10 reps   cane     Shoulder Exercises: Standing   Internal Rotation  AAROM;Right;10  reps   cane   ABduction  AAROM;Right;10 reps   scaption   Extension  Both;15 reps;Theraband    Theraband Level (Shoulder Extension)  Level 2 (Red)    Row  Both;15 reps;Theraband    Theraband Level (Shoulder Row)  Level 2 (Red)      Shoulder Exercises: Pulleys   Flexion  2 minutes    Scaption  2 minutes      Manual Therapy   Manual Therapy  Joint mobilization;Soft tissue mobilization;Passive ROM    Joint Mobilization  Rt shoulder grades 2-3 inf and A/P    Soft tissue mobilization  rt upper trap and deltoids    Passive ROM  Rt shoulder flexion, abduction/scaption, IR/ER             PT Education - 02/06/19 1551    Education Details  DN    Person(s) Educated  Patient    Methods  Handout;Explanation    Comprehension  Verbalized understanding          PT Long Term Goals - 01/30/19 1545  PT LONG TERM GOAL #1   Title  independent with HEP    Status  New    Target Date  03/13/19      PT LONG TERM GOAL #2   Title  Rt shoulder flexion and abduction AROM improved at least 15 degrees for improved function    Status  New    Target Date  03/13/19      PT LONG TERM GOAL #3   Title  report pain < 4/10 with activity and reaching for improved function    Status  New    Target Date  03/13/19      PT LONG TERM GOAL #4   Title  demonstrate Rt shoulder functional internal rotation to at least L1 for improved function and greater ease with dressing    Status  New    Target Date  03/13/19            Plan - 02/06/19 1551    Clinical Impression Statement  Pt with improved active Rt shoulder flexion today, with external rotation unchanged at this time.  Slowly progressing towards goals, with ROM goal partially met at this time (will address and measure abduction next visit).  Will continue to benefit from PT to maximize function.    Examination-Activity Limitations  Bathing;Hygiene/Grooming;Reach Overhead;Toileting;Dressing    Examination-Participation Restrictions   Cleaning;Meal Prep;Other   work   Merchant navy officer  Evolving/Moderate complexity    Rehab Potential  Good    PT Frequency  2x / week    PT Duration  6 weeks    PT Treatment/Interventions  ADLs/Self Care Home Management;Cryotherapy;Ultrasound;Moist Heat;Iontophoresis 74m/ml Dexamethasone;Electrical Stimulation;Therapeutic exercise;Therapeutic activities;Patient/family education;Manual techniques;Dry needling;Passive range of motion;Taping    PT Next Visit Plan  try heat before manual/stretching, continue with AAROM and PROM, manual therapy (consider DN to upper traps/deltoids/rotator cuff - may provide some pain control and decrease muscular tightness)    PT Home Exercise Plan  Access Code: [email or web address]   Consulted and Agree with Plan of Care  Patient       Patient will benefit from skilled therapeutic intervention in order to improve the following deficits and impairments:  Increased fascial restricitons, Pain, Postural dysfunction, Decreased strength, Decreased range of motion, Hypomobility  Visit Diagnosis: Stiffness of right shoulder, not elsewhere classified  Acute pain of right shoulder  Abnormal posture  Muscle weakness (generalized)     Problem List Patient Active Problem List   Diagnosis Date Noted  . Frozen shoulder 12/13/2018  . Rotator cuff tear, right 11/15/2018      SLaureen Abrahams PT, DPT 02/06/19 3:55 PM     [place] [address]Phone: [phone]  Fax:  [phone] Name: DBreckin ZafarMRN: [id number] of Birth: [birth year]

## 2019-02-11 ENCOUNTER — Other Ambulatory Visit: Payer: Self-pay

## 2019-02-11 ENCOUNTER — Encounter: Payer: Self-pay | Admitting: Physical Therapy

## 2019-02-11 ENCOUNTER — Ambulatory Visit (INDEPENDENT_AMBULATORY_CARE_PROVIDER_SITE_OTHER): Payer: BLUE CROSS/BLUE SHIELD | Admitting: Physical Therapy

## 2019-02-11 DIAGNOSIS — M6281 Muscle weakness (generalized): Secondary | ICD-10-CM

## 2019-02-11 DIAGNOSIS — M25511 Pain in right shoulder: Secondary | ICD-10-CM

## 2019-02-11 DIAGNOSIS — R293 Abnormal posture: Secondary | ICD-10-CM

## 2019-02-11 DIAGNOSIS — M25611 Stiffness of right shoulder, not elsewhere classified: Secondary | ICD-10-CM

## 2019-02-11 NOTE — Therapy (Signed)
[facility] [address] [place], [place], [address] Phone: [phone]   Fax:  [phone]  Physical Therapy Treatment  Patient Details  Name: Sandy SpiesDawn Roycroft MRN: [id number] Date of Birth: 10/07/1970 Referring Provider (PT): Dr. Terrilee FilesZach Smith   Encounter Date: 02/11/2019  PT End of Session - 02/11/19 1549    Visit Number  3    Number of Visits  12    Date for PT Re-Evaluation  03/13/19    PT Start Time  1500    PT Stop Time  1538    PT Time Calculation (min)  38 min    Activity Tolerance  Patient tolerated treatment well    Behavior During Therapy  [facility] for tasks assessed/performed       History reviewed. No pertinent past medical history.  History reviewed. No pertinent surgical history.  There were no vitals filed for this visit.  Subjective Assessment - 02/11/19 1459    Subjective  Pt states pain is variable, but motion seems to be improving.   (Pended)     Patient Stated Goals  improve shoulder mobility and pain  (Pended)     Currently in Pain?  Yes  (Pended)     Pain Score  4   (Pended)     Pain Location  Shoulder  (Pended)     Pain Orientation  Right  (Pended)     Pain Descriptors / Indicators  Aching  (Pended)     Pain Type  Acute pain  (Pended)     Pain Onset  More than a month ago  (Pended)     Pain Frequency  Intermittent  (Pended)                        [facility] Adult PT Treatment/Exercise - 02/11/19 1531      Bed Mobility   Bed Mobility  --      Shoulder Exercises: Supine   External Rotation  AAROM;Right;20 reps   cane   Flexion  AAROM;Right;15 reps   cane   Other Supine Exercises  Supine ER butterfly 10 sec x10;       Shoulder Exercises: Seated   Other Seated Exercises  UT stretch 30 sec x2 on R;       Shoulder Exercises: Standing   Internal Rotation  AAROM;Right;10 reps   Hands behind back   ABduction  --   scaption   Extension  --    Theraband Level (Shoulder Extension)  --    Row   Both;Theraband;20 reps    Theraband Level (Shoulder Row)  Level 2 (Red)    Other Standing Exercises  Post shoulder stretch 30 sec x3 on R;     Other Standing Exercises  Wall Slides 2 UE x15;       Shoulder Exercises: Pulleys   Flexion  2 minutes    Scaption  2 minutes      Shoulder Exercises: Stretch   Corner Stretch  20 seconds    Corner Stretch Limitations  in doorway/90 deg      Manual Therapy   Manual Therapy  Joint mobilization;Soft tissue mobilization;Passive ROM    Joint Mobilization  Rt shoulder grades 2-3 inf and A/P    Soft tissue mobilization  --    Passive ROM  R shoulder, all motions,  Manual stretch for Bil UT;  Subscap release (light)              PT Education -  02/11/19 1549    Education Details  HEP updated    Person(s) Educated  Patient    Methods  Explanation    Comprehension  Verbalized understanding          PT Long Term Goals - 01/30/19 1545      PT LONG TERM GOAL #1   Title  independent with HEP    Status  New    Target Date  03/13/19      PT LONG TERM GOAL #2   Title  Rt shoulder flexion and abduction AROM improved at least 15 degrees for improved function    Status  New    Target Date  03/13/19      PT LONG TERM GOAL #3   Title  report pain < 4/10 with activity and reaching for improved function    Status  New    Target Date  03/13/19      PT LONG TERM GOAL #4   Title  demonstrate Rt shoulder functional internal rotation to at least L1 for improved function and greater ease with dressing    Status  New    Target Date  03/13/19            Plan - 02/11/19 1550    Clinical Impression Statement  Pt with stiffness in all ranges, especially for IR/ER. Progressed stretches for improving ROM today, discussed increasing frequency of motion at home. Pt continues to be very limtied with PROm and AROM.     Examination-Activity Limitations  Bathing;Hygiene/Grooming;Reach Overhead;Toileting;Dressing    Examination-Participation  Restrictions  Cleaning;Meal Prep;Other   work   Conservation officer, historic buildings  Evolving/Moderate complexity    Rehab Potential  Good    PT Frequency  2x / week    PT Duration  6 weeks    PT Treatment/Interventions  ADLs/Self Care Home Management;Cryotherapy;Ultrasound;Moist Heat;Iontophoresis 4mg /ml Dexamethasone;Electrical Stimulation;Therapeutic exercise;Therapeutic activities;Patient/family education;Manual techniques;Dry needling;Passive range of motion;Taping    PT Next Visit Plan  try heat before manual/stretching, continue with AAROM and PROM, manual therapy (consider DN to upper traps/deltoids/rotator cuff - may provide some pain control and decrease muscular tightness)    PT Home Exercise Plan  Access Code: [email or web address]    Consulted and Agree with Plan of Care  Patient       Patient will benefit from skilled therapeutic intervention in order to improve the following deficits and impairments:  Increased fascial restricitons, Pain, Postural dysfunction, Decreased strength, Decreased range of motion, Hypomobility  Visit Diagnosis: Stiffness of right shoulder, not elsewhere classified  Acute pain of right shoulder  Abnormal posture  Muscle weakness (generalized)     Problem List Patient Active Problem List   Diagnosis Date Noted  . Frozen shoulder 12/13/2018  . Rotator cuff tear, right 11/15/2018   Sedalia Muta, PT, DPT 3:51 PM  02/11/19    [facility] [address] [place], [place], [address] Phone: [phone]   Fax:  [phone]  Name: Sandy Jordan MRN: [id number] Date of Birth: 30-Sep-1970

## 2019-02-14 ENCOUNTER — Other Ambulatory Visit: Payer: Self-pay

## 2019-02-14 ENCOUNTER — Ambulatory Visit (INDEPENDENT_AMBULATORY_CARE_PROVIDER_SITE_OTHER): Payer: BLUE CROSS/BLUE SHIELD | Admitting: Physical Therapy

## 2019-02-14 DIAGNOSIS — M25511 Pain in right shoulder: Secondary | ICD-10-CM

## 2019-02-14 DIAGNOSIS — M25611 Stiffness of right shoulder, not elsewhere classified: Secondary | ICD-10-CM

## 2019-02-17 ENCOUNTER — Encounter: Payer: Self-pay | Admitting: Physical Therapy

## 2019-02-17 NOTE — Therapy (Signed)
[facility] [address] [place], [place], [address] Phone: [phone]   Fax:  [phone]  Physical Therapy Treatment  Patient Details  Name: Sandy Jordan MRN: [id number] Date of Birth: 03/19/1971 Referring Provider (PT): Dr. Charlann Boxer   Encounter Date: 02/14/2019  PT End of Session - 02/17/19 1347    Visit Number  4    Number of Visits  12    Date for PT Re-Evaluation  03/13/19    PT Start Time  1500    PT Stop Time  1543    PT Time Calculation (min)  43 min    Activity Tolerance  Patient tolerated treatment well    Behavior During Therapy  [facility] for tasks assessed/performed       History reviewed. No pertinent past medical history.  History reviewed. No pertinent surgical history.  There were no vitals filed for this visit.  Subjective Assessment - 02/17/19 1346    Subjective  Pt states she was sore after last session for a day or so, does feel arm is moving better.     Currently in Pain?  Yes    Pain Score  4     Pain Location  Shoulder    Pain Orientation  Right    Pain Descriptors / Indicators  Aching    Pain Type  Acute pain    Pain Onset  More than a month ago    Pain Frequency  Intermittent         [facility] PT Assessment - 02/17/19 0001      AROM   Right Shoulder Flexion  110 Degrees    Right Shoulder ABduction  105 Degrees    Right Shoulder External Rotation  25 Degrees      PROM   Right Shoulder Flexion  130 Degrees                   [facility] Adult PT Treatment/Exercise - 02/17/19 0001      Shoulder Exercises: Supine   External Rotation  AAROM;Right;20 reps   cane   Flexion  AAROM;Right;15 reps   cane   Other Supine Exercises  Supine ER butterfly 10 sec x10;       Shoulder Exercises: Standing   Internal Rotation  AAROM;Right;10 reps   Hands behind back   Flexion  AROM;10 reps    ABduction  AROM;10 reps    Row  Both;Theraband;20 reps    Theraband Level (Shoulder Row)  Level 3  (Green)    Other Standing Exercises  Post shoulder stretch 30 sec x3 on R;     Other Standing Exercises  Wall Slides 1 UE x15;       Shoulder Exercises: Pulleys   Flexion  2 minutes    Scaption  2 minutes      Shoulder Exercises: Stretch   Corner Stretch  20 seconds    Corner Stretch Limitations  in doorway/90 deg      Manual Therapy   Manual Therapy  Joint mobilization;Soft tissue mobilization;Passive ROM    Joint Mobilization  Rt shoulder grades 2-3 inf and A/P    Passive ROM  R shoulder, all motions,  Manual stretch for Bil UT;                   PT Long Term Goals - 01/30/19 1545      PT LONG TERM GOAL #1   Title  independent with HEP    Status  New  Target Date  03/13/19      PT LONG TERM GOAL #2   Title  Rt shoulder flexion and abduction AROM improved at least 15 degrees for improved function    Status  New    Target Date  03/13/19      PT LONG TERM GOAL #3   Title  report pain < 4/10 with activity and reaching for improved function    Status  New    Target Date  03/13/19      PT LONG TERM GOAL #4   Title  demonstrate Rt shoulder functional internal rotation to at least L1 for improved function and greater ease with dressing    Status  New    Target Date  03/13/19            Plan - 02/17/19 1348    Clinical Impression Statement  Pt with mild improvements of PROM and AROM with measurements taken today. Discussed frequent, light ROM at home. Pt with significant guarding during PROM. Continues to be most limted for ER /IR and ABd.     Examination-Activity Limitations  Bathing;Hygiene/Grooming;Reach Overhead;Toileting;Dressing    Examination-Participation Restrictions  Cleaning;Meal Prep;Other   work   Conservation officer, historic buildingstability/Clinical Decision Making  Evolving/Moderate complexity    Rehab Potential  Good    PT Frequency  2x / week    PT Duration  6 weeks    PT Treatment/Interventions  ADLs/Self Care Home Management;Cryotherapy;Ultrasound;Moist  Heat;Iontophoresis 4mg /ml Dexamethasone;Electrical Stimulation;Therapeutic exercise;Therapeutic activities;Patient/family education;Manual techniques;Dry needling;Passive range of motion;Taping    PT Next Visit Plan  try heat before manual/stretching, continue with AAROM and PROM, manual therapy (consider DN to upper traps/deltoids/rotator cuff - may provide some pain control and decrease muscular tightness)    PT Home Exercise Plan  Access Code: [email or web address]    Consulted and Agree with Plan of Care  Patient       Patient will benefit from skilled therapeutic intervention in order to improve the following deficits and impairments:  Increased fascial restricitons, Pain, Postural dysfunction, Decreased strength, Decreased range of motion, Hypomobility  Visit Diagnosis: Stiffness of right shoulder, not elsewhere classified  Acute pain of right shoulder     Problem List Patient Active Problem List   Diagnosis Date Noted  . Frozen shoulder 12/13/2018  . Rotator cuff tear, right 11/15/2018    Sedalia MutaLauren Legacy Carrender, PT, DPT 1:55 PM  02/17/19    [facility] [address] [address] , [place], [address] Phone: [phone]   Fax:  [phone]  Name: Sandy Jordan MRN: [id number] Date of Birth: 10/05/1970

## 2019-02-18 ENCOUNTER — Encounter: Payer: BLUE CROSS/BLUE SHIELD | Admitting: Physical Therapy

## 2019-02-21 ENCOUNTER — Ambulatory Visit (INDEPENDENT_AMBULATORY_CARE_PROVIDER_SITE_OTHER): Payer: BLUE CROSS/BLUE SHIELD | Admitting: Physical Therapy

## 2019-02-21 ENCOUNTER — Encounter: Payer: Self-pay | Admitting: Physical Therapy

## 2019-02-21 ENCOUNTER — Other Ambulatory Visit: Payer: Self-pay

## 2019-02-21 DIAGNOSIS — M25511 Pain in right shoulder: Secondary | ICD-10-CM | POA: Diagnosis not present

## 2019-02-21 DIAGNOSIS — M25611 Stiffness of right shoulder, not elsewhere classified: Secondary | ICD-10-CM | POA: Diagnosis not present

## 2019-02-21 NOTE — Therapy (Signed)
[facility] [address] [place], [place], [address] Phone: [phone]   Fax:  [phone]  Physical Therapy Treatment  Patient Details  Name: Jalexus Brett MRN: [id number] Date of Birth: 18-Mar-1971 Referring Provider (PT): Dr. Charlann Boxer   Encounter Date: 02/21/2019  PT End of Session - 02/21/19 1548    Visit Number  5    Number of Visits  12    Date for PT Re-Evaluation  03/13/19    PT Start Time  1500    PT Stop Time  1545    PT Time Calculation (min)  45 min    Activity Tolerance  Patient tolerated treatment well    Behavior During Therapy  [facility] for tasks assessed/performed       History reviewed. No pertinent past medical history.  History reviewed. No pertinent surgical history.  There were no vitals filed for this visit.  Subjective Assessment - 02/21/19 1547    Subjective  Pt feels shoulder motion is improving.    Limitations  Lifting;Writing;House hold activities    Patient Stated Goals  Decreased pain, improved movement/reaching.    Currently in Pain?  Yes    Pain Location  Shoulder    Pain Orientation  Right    Pain Descriptors / Indicators  Aching    Pain Type  Acute pain    Pain Onset  More than a month ago    Pain Frequency  Intermittent                       [facility] Adult PT Treatment/Exercise - 02/21/19 1503      Shoulder Exercises: Supine   Protraction  15 reps    Protraction Limitations  SA punch with cane    External Rotation  AAROM;Right;20 reps   cane   Flexion  AAROM;Right;15 reps   cane   Other Supine Exercises  --      Shoulder Exercises: Standing   Internal Rotation  AAROM;Right;10 reps   Hands behind back   Flexion  AROM;10 reps    ABduction  AROM;10 reps    Row  Both;Theraband;20 reps    Theraband Level (Shoulder Row)  Level 3 (Green)    Other Standing Exercises  Post shoulder stretch 30 sec x3 on R;     Other Standing Exercises  Wall Slides 1 UE x15 each for  flexion and circles;       Shoulder Exercises: Pulleys   Flexion  2 minutes    Scaption  2 minutes      Shoulder Exercises: Stretch   Corner Stretch  20 seconds    Corner Stretch Limitations  in doorway/45 deg      Manual Therapy   Manual Therapy  Joint mobilization;Soft tissue mobilization;Passive ROM    Joint Mobilization  Rt shoulder grades 2-3 inf and A/P, scapular mobs    Passive ROM  R shoulder, all motions,  Subscap release,                   PT Long Term Goals - 01/30/19 1545      PT LONG TERM GOAL #1   Title  independent with HEP    Status  New    Target Date  03/13/19      PT LONG TERM GOAL #2   Title  Rt shoulder flexion and abduction AROM improved at least 15 degrees for improved function    Status  New    Target Date  03/13/19      PT LONG TERM GOAL #3   Title  report pain < 4/10 with activity and reaching for improved function    Status  New    Target Date  03/13/19      PT LONG TERM GOAL #4   Title  demonstrate Rt shoulder functional internal rotation to at least L1 for improved function and greater ease with dressing    Status  New    Target Date  03/13/19            Plan - 02/21/19 1550    Clinical Impression Statement  Pt showing mild ROM improvements each week. She has improving ability for AROM for elevation. She has been able to tolerate manual therapy well, but does have significant guarding. Pt to benefit from continued care.    Examination-Activity Limitations  Bathing;Hygiene/Grooming;Reach Overhead;Toileting;Dressing    Examination-Participation Restrictions  Cleaning;Meal Prep;Other   work   Conservation officer, historic buildingstability/Clinical Decision Making  Evolving/Moderate complexity    Rehab Potential  Good    PT Frequency  2x / week    PT Duration  6 weeks    PT Treatment/Interventions  ADLs/Self Care Home Management;Cryotherapy;Ultrasound;Moist Heat;Iontophoresis 4mg /ml Dexamethasone;Electrical Stimulation;Therapeutic exercise;Therapeutic  activities;Patient/family education;Manual techniques;Dry needling;Passive range of motion;Taping    PT Home Exercise Plan  Access Code: [email or web address]    Consulted and Agree with Plan of Care  Patient       Patient will benefit from skilled therapeutic intervention in order to improve the following deficits and impairments:  Increased fascial restricitons, Pain, Postural dysfunction, Decreased strength, Decreased range of motion, Hypomobility  Visit Diagnosis: Stiffness of right shoulder, not elsewhere classified  Acute pain of right shoulder     Problem List Patient Active Problem List   Diagnosis Date Noted  . Frozen shoulder 12/13/2018  . Rotator cuff tear, right 11/15/2018    Sedalia MutaLauren Findlay Dagher, PT, DPT 3:51 PM  02/21/19    [facility] [address] [place], [place], [address] Phone: [phone]   Fax:  [phone]  Name: Ronie SpiesDawn Nay MRN: [id number] Date of Birth: 07/02/1971

## 2019-02-25 ENCOUNTER — Ambulatory Visit (INDEPENDENT_AMBULATORY_CARE_PROVIDER_SITE_OTHER): Payer: BC Managed Care – PPO | Admitting: Physical Therapy

## 2019-02-25 ENCOUNTER — Encounter: Payer: Self-pay | Admitting: Physical Therapy

## 2019-02-25 ENCOUNTER — Encounter: Payer: BLUE CROSS/BLUE SHIELD | Admitting: Physical Therapy

## 2019-02-25 ENCOUNTER — Ambulatory Visit (INDEPENDENT_AMBULATORY_CARE_PROVIDER_SITE_OTHER): Payer: BC Managed Care – PPO | Admitting: Family Medicine

## 2019-02-25 ENCOUNTER — Encounter: Payer: Self-pay | Admitting: Family Medicine

## 2019-02-25 ENCOUNTER — Other Ambulatory Visit: Payer: Self-pay

## 2019-02-25 VITALS — BP 100/70 | HR 58 | Ht 66.0 in | Wt 165.0 lb

## 2019-02-25 DIAGNOSIS — M25611 Stiffness of right shoulder, not elsewhere classified: Secondary | ICD-10-CM

## 2019-02-25 DIAGNOSIS — G8929 Other chronic pain: Secondary | ICD-10-CM | POA: Diagnosis not present

## 2019-02-25 DIAGNOSIS — M7501 Adhesive capsulitis of right shoulder: Secondary | ICD-10-CM

## 2019-02-25 DIAGNOSIS — M75111 Incomplete rotator cuff tear or rupture of right shoulder, not specified as traumatic: Secondary | ICD-10-CM | POA: Diagnosis not present

## 2019-02-25 DIAGNOSIS — M25511 Pain in right shoulder: Secondary | ICD-10-CM

## 2019-02-25 NOTE — Therapy (Signed)
[facility] [address] [address] [place], [place], [address] Phone: [phone]   Fax:  [phone]  Physical Therapy Treatment  Patient Details  Name: Sandy SpiesDawn Jordan MRN: [id number] Date of Birth: 02/12/1971 Referring Provider (PT): Dr. Terrilee FilesZach Smith   Encounter Date: 02/25/2019  PT End of Session - 02/25/19 1304    Visit Number  6    Number of Visits  12    Date for PT Re-Evaluation  03/13/19    PT Start Time  1259    PT Stop Time  1345    PT Time Calculation (min)  46 min    Activity Tolerance  Patient tolerated treatment well    Behavior During Therapy  [facility] for tasks assessed/performed       History reviewed. No pertinent past medical history.  History reviewed. No pertinent surgical history.  There were no vitals filed for this visit.  Subjective Assessment - 02/25/19 1303    Subjective  Pt states variable soreness, some nighs is very sore when trying to sleep.    Currently in Pain?  Yes    Pain Location  Shoulder    Pain Orientation  Right    Pain Descriptors / Indicators  Aching    Pain Type  Acute pain    Pain Onset  More than a month ago    Pain Frequency  Intermittent         [facility] PT Assessment - 02/25/19 0001      PROM   Right Shoulder Flexion  130 Degrees    Right Shoulder Internal Rotation  50 Degrees    Right Shoulder External Rotation  45 Degrees                   [facility] Adult PT Treatment/Exercise - 02/25/19 1305      Shoulder Exercises: Supine   Protraction  --    Protraction Limitations  --    External Rotation  AAROM;Right;20 reps   cane   Flexion  AAROM;Right;15 reps   cane     Shoulder Exercises: Standing   Internal Rotation  AAROM;Right;10 reps   Hands behind back, and with stick    Flexion  --    ABduction  --    Row  --    Theraband Level (Shoulder Row)  --    Other Standing Exercises  --    Other Standing Exercises  Wall Slides 1 UE x15 each for flexion and circles;  wall  push ups x12;       Shoulder Exercises: Pulleys   Flexion  2 minutes    Scaption  2 minutes      Shoulder Exercises: Stretch   Corner Stretch  --    Corner Stretch Limitations  --      Manual Therapy   Manual Therapy  Joint mobilization;Soft tissue mobilization;Passive ROM    Joint Mobilization  Rt shoulder grades 2-3 inf and A/P, scapular mobs    Passive ROM  R shoulder, all motions,  Subscap release,                   PT Long Term Goals - 01/30/19 1545      PT LONG TERM GOAL #1   Title  independent with HEP    Status  New    Target Date  03/13/19      PT LONG TERM GOAL #2   Title  Rt shoulder flexion and abduction AROM improved at least 15 degrees for  improved function    Status  New    Target Date  03/13/19      PT LONG TERM GOAL #3   Title  report pain < 4/10 with activity and reaching for improved function    Status  New    Target Date  03/13/19      PT LONG TERM GOAL #4   Title  demonstrate Rt shoulder functional internal rotation to at least L1 for improved function and greater ease with dressing    Status  New    Target Date  03/13/19            Plan - 02/25/19 1350    Clinical Impression Statement  Pt showing ROM improvements for horiz Abd, and rotation today. Still with significant guarding and moderate soreness with PROM. Pt to benefit from continued care.    Examination-Activity Limitations  Bathing;Hygiene/Grooming;Reach Overhead;Toileting;Dressing    Examination-Participation Restrictions  Cleaning;Meal Prep;Other   work   Merchant navy officer  Evolving/Moderate complexity    Rehab Potential  Good    PT Frequency  2x / week    PT Duration  6 weeks    PT Treatment/Interventions  ADLs/Self Care Home Management;Cryotherapy;Ultrasound;Moist Heat;Iontophoresis 4mg /ml Dexamethasone;Electrical Stimulation;Therapeutic exercise;Therapeutic activities;Patient/family education;Manual techniques;Dry needling;Passive range of  motion;Taping    PT Home Exercise Plan  Access Code: [email or web address]    Consulted and Agree with Plan of Care  Patient       Patient will benefit from skilled therapeutic intervention in order to improve the following deficits and impairments:  Increased fascial restricitons, Pain, Postural dysfunction, Decreased strength, Decreased range of motion, Hypomobility  Visit Diagnosis: Stiffness of right shoulder, not elsewhere classified  Acute pain of right shoulder     Problem List Patient Active Problem List   Diagnosis Date Noted  . Frozen shoulder 12/13/2018  . Rotator cuff tear, right 11/15/2018    [name], PT, DPT 1:51 PM  02/25/19    [facility] [place], [place], [address] Phone: [phone]   Fax:  [phone]  Name: Sandy Jordan MRN: [id number] Date of Birth: 1971-06-14

## 2019-02-25 NOTE — Assessment & Plan Note (Signed)
Patient continues to have some difficulty.  Discussed which activities to do which will still avoid.  Discussed which activities that would be beneficial.  I do feel that an MRI with arthrogram is necessary with the longevity of this problem.  Patient is having increasing atrophy of the shoulder and concern there could be some possible nerve injury at this time or labral pathology that is contributing to the lack of motion.  Patient is in agreement the plan and depending on the MRI we will discuss further evaluation and treatment afterwards.

## 2019-02-25 NOTE — Patient Instructions (Addendum)
Good to see you  MR-arthrogram of the shoulder  Ice is your friend I will write you when I get the results and talk about the options.

## 2019-02-25 NOTE — Progress Notes (Signed)
[facility] [address] [place], [place] [address] Phone: [phone] Subjective:   I Sandy Jordan am serving as a Education administrator for Dr. Hulan Saas.    CC: Shoulder pain follow-up  [email or web address]  Sandy Jordan is a 48 y.o. female coming in with complaint of right shoulder pain. States that some days are good and some are bad. Seeing some improvement in PT. patient was found to have more of a partial rotator cuff tear that significantly previously.  Has had problems with the shoulder.  Has a previous was diagnosed with more of a frozen shoulder.  Starting to have increasing discomfort again.  Patient states that the range of motion has not improved.Sandy Jordan     Has been going to formal physical therapy for some time now.  No past medical history on file. No past surgical history on file. Social History   Socioeconomic History  . Marital status: Divorced    Spouse name: Not on file  . Number of children: Not on file  . Years of education: Not on file  . Highest education level: Not on file  Occupational History  . Not on file  Social Needs  . Financial resource strain: Not on file  . Food insecurity    Worry: Not on file    Inability: Not on file  . Transportation needs    Medical: Not on file    Non-medical: Not on file  Tobacco Use  . Smoking status: Not on file  Substance and Sexual Activity  . Alcohol use: Not on file  . Drug use: Not on file  . Sexual activity: Not on file  Lifestyle  . Physical activity    Days per week: Not on file    Minutes per session: Not on file  . Stress: Not on file  Relationships  . Social Herbalist on phone: Not on file    Gets together: Not on file    Attends religious service: Not on file    Active member of club or organization: Not on file    Attends meetings of clubs or organizations: Not on file    Relationship status: Not on file  Other Topics Concern  . Not on file  Social History  Narrative  . Not on file   Not on File No family history on file.   Current Outpatient Medications (Cardiovascular):  .  nitroGLYCERIN (NITRODUR - DOSED IN MG/24 HR) 0.2 mg/hr patch, 1/4 patch daily (Patient not taking: Reported on 01/30/2019)     Current Outpatient Medications (Other):  Sandy Jordan  Diclofenac Sodium 2 % SOLN, Place 2 g onto the skin 2 (two) times daily. Sandy Jordan  gabapentin (NEURONTIN) 100 MG capsule, Take 2 capsules (200 mg total) by mouth at bedtime. .  Vitamin D, Ergocalciferol, (DRISDOL) 1.25 MG (50000 UT) CAPS capsule, Take 1 capsule (50,000 Units total) by mouth every 7 (seven) days.    Past medical history, social, surgical and family history all reviewed in electronic medical record.  No pertanent information unless stated regarding to the chief complaint.   Review of Systems:  No headache, visual changes, nausea, vomiting, diarrhea, constipation, dizziness, abdominal pain, skin rash, fevers, chills, night sweats, weight loss, swollen lymph nodes, body aches, joint swelling, muscle aches, chest pain, shortness of breath, mood changes.   Objective  There were no vitals taken for this visit. Systems examined below as of    General: No apparent distress alert and oriented x3  mood and affect normal, dressed appropriately.  HEENT: Pupils equal, extraocular movements intact  Respiratory: Patient's speak in full sentences and does not appear short of breath  Cardiovascular: No lower extremity edema, non tender, no erythema  Skin: Warm dry intact with no signs of infection or rash on extremities or on axial skeleton.  Abdomen: Soft nontender  Neuro: Cranial nerves II through XII are intact, neurovascularly intact in all extremities with 2+ DTRs and 2+ pulses.  Lymph: No lymphadenopathy of posterior or anterior cervical chain or axillae bilaterally.  Gait normal with good balance and coordination.  MSK:  Non tender with full range of motion and good stability and symmetric  strength and tone of  elbows, wrist, hip, knee and ankles bilaterally.   Shoulder: Right shoulder Inspection reveals atrophy noted compared to contralateral side this is worse than previous exams Palpation shows tenderness to palpation of the shoulder as well ROM is decreased with 160 degrees of forward flexion, external rotation of 5 degrees and internal rotation to hip Rotator cuff strength 3+ out of 5 which is worse Positive impingement Speeds and Yergason's tests normal. Positive O'Brien's Normal scapular function observed. No painful arc and no drop arm sign. No apprehension sign Contralateral shoulder unremarkable   MSK US performed of: right This study was ordered, performed, and interpreted by Terrilee FilesZach Gayland Nicol D.O.  Shoulder:   Supraspinatus: Tearing noted but no significant retraction.  Subacromial bursitis noted  AC joint:  capsulitis noted Glenohumeral Joint: Some calcific changes which is a different than previous exam Glenoid Labrum:  Intact without visualized tears. Biceps Tendon:  Appears normal on long and transverse views, no fraying of tendon, tendon located in intertubercular groove, no subluxation with shoulder internal or external rotation. No increased power doppler signal. Impression: Frozen shoulder, calcific changes to the glenohumeral joint, possible rotator cuff tear   Impression and Recommendations:     This case required medical decision making of moderate complexity. The above documentation has been reviewed and is accurate and complete Judi SaaZachary M Laetitia Schnepf, DO       Note: This dictation was prepared with Dragon dictation along with smaller phrase technology. Any transcriptional errors that result from this process are unintentional.

## 2019-02-28 ENCOUNTER — Ambulatory Visit (INDEPENDENT_AMBULATORY_CARE_PROVIDER_SITE_OTHER): Payer: BC Managed Care – PPO | Admitting: Physical Therapy

## 2019-02-28 ENCOUNTER — Other Ambulatory Visit: Payer: Self-pay

## 2019-02-28 ENCOUNTER — Encounter: Payer: Self-pay | Admitting: Physical Therapy

## 2019-02-28 DIAGNOSIS — M25611 Stiffness of right shoulder, not elsewhere classified: Secondary | ICD-10-CM

## 2019-02-28 DIAGNOSIS — M25511 Pain in right shoulder: Secondary | ICD-10-CM | POA: Diagnosis not present

## 2019-02-28 NOTE — Therapy (Signed)
[facility] [address] [address] [place], [place], [address] Phone: [phone]   Fax:  [phone]  Physical Therapy Treatment  Patient Details  Name: Sandy SpiesDawn Yankowski MRN: [id number] Date of Birth: 06/20/1971 Referring Provider (PT): Dr. Terrilee FilesZach Smith   Encounter Date: 02/28/2019  PT End of Session - 02/28/19 1553    Visit Number  7    Number of Visits  12    Date for PT Re-Evaluation  03/13/19    PT Start Time  1505    PT Stop Time  1549    PT Time Calculation (min)  44 min    Activity Tolerance  Patient tolerated treatment well    Behavior During Therapy  [facility] for [facility] assessed/per[facility]       History reviewed. No pertinent past medical history.  History reviewed. No pertinent surgical history.  There were no vitals filed for this visit.  Subjective Assessment - 02/28/19 1552    Subjective  Pt sore today. She saw MD, will be having MRI , not scheduled yet    Limitations  Lifting;Writing;House hold activities    Patient Stated Goals  Decreased pain, improved movement/reaching.    Currently in Pain?  Yes    Pain Score  4     Pain Location  Shoulder    Pain Descriptors / Indicators  Aching    Pain Type  Acute pain    Pain Onset  More than a month ago    Pain Frequency  Intermittent                       [facility] Adult PT Treatment/Exercise - 02/28/19 1509      Shoulder Exercises: Supine   Protraction  20 reps    Horizontal ABduction  AROM;10 reps    External Rotation  AAROM;Right;20 reps   cane   External Rotation Limitations  AROM 2lb x20 supine     Flexion  AAROM;Right;15 reps   cane   Flexion Limitations  AROM x10 no weight, supine      Shoulder Exercises: Standing   Internal Rotation  AAROM;Right;10 reps   Hands behind back, and with stick    Flexion  AROM;10 reps    ABduction  AROM;10 reps    Row  Both;Theraband;20 reps    Theraband Level (Shoulder Row)  Level 3 (Green)    Other Standing Exercises   Post shoulder stretch 30 sec x3 on R;     Other Standing Exercises  Wall Slides 1 UE x15 each for flexion and circles;  wall push ups x12;       Shoulder Exercises: Pulleys   Flexion  2 minutes    Scaption  2 minutes      Shoulder Exercises: Stretch   Corner Stretch  20 seconds;3 reps    Corner Stretch Limitations  in doorway/90 and low      Manual Therapy   Manual Therapy  Joint mobilization;Soft tissue mobilization;Passive ROM    Joint Mobilization  Rt shoulder grades 2-3 inf and A/P,     Passive ROM  R shoulder, all motions,  Subscap release,                   PT Long Term Goals - 01/30/19 1545      PT LONG TERM GOAL #1   Title  independent with HEP    Status  New    Target Date  03/13/19      PT LONG  TERM GOAL #2   Title  Rt shoulder flexion and abduction AROM improved at least 15 degrees for improved function    Status  New    Target Date  03/13/19      PT LONG TERM GOAL #3   Title  report pain < 4/10 with activity and reaching for improved function    Status  New    Target Date  03/13/19      PT LONG TERM GOAL #4   Title  demonstrate Rt shoulder functional internal rotation to at least L1 for improved function and greater ease with dressing    Status  New    Target Date  03/13/19            Plan - 02/28/19 1554    Clinical Impression Statement  Pt with increased guarding today, due to pain. She is improving with ability for AROM, but continues to have significant joint stiffness that is limiting all ROM. Pt to benefit from continued care.    Examination-Activity Limitations  Bathing;Hygiene/Grooming;Reach Overhead;Toileting;Dressing    Examination-Participation Restrictions  Cleaning;Meal Prep;Other   work   Merchant navy officer  Evolving/Moderate complexity    Rehab Potential  Good    PT Frequency  2x / week    PT Duration  6 weeks    PT Treatment/Interventions  ADLs/Self Care Home Management;Cryotherapy;Ultrasound;Moist  Heat;Iontophoresis 4mg /ml Dexamethasone;Electrical Stimulation;Therapeutic exercise;Therapeutic activities;Patient/family education;Manual techniques;Dry needling;Passive range of motion;Taping    PT Home Exercise Plan  Access Code: [email or web address]    Consulted and Agree with Plan of Care  Patient       Patient will benefit from skilled therapeutic intervention in order to improve the following deficits and impairments:  Increased fascial restricitons, Pain, Postural dysfunction, Decreased strength, Decreased range of motion, Hypomobility  Visit Diagnosis: 1. Stiffness of right shoulder, not elsewhere classified   2. Acute pain of right shoulder        Problem List Patient Active Problem List   Diagnosis Date Noted  . Frozen shoulder 12/13/2018  . Rotator cuff tear, right 11/15/2018   [name], PT, DPT 3:55 PM  02/28/19    [place], [place], [address] Phone: [phone]   Fax:  [phone]  Name: Sandy Jordan MRN: [id number] Date of Birth: 1971-06-22

## 2019-03-07 ENCOUNTER — Ambulatory Visit (INDEPENDENT_AMBULATORY_CARE_PROVIDER_SITE_OTHER): Payer: BC Managed Care – PPO | Admitting: Physical Therapy

## 2019-03-07 ENCOUNTER — Other Ambulatory Visit: Payer: Self-pay

## 2019-03-07 DIAGNOSIS — M6281 Muscle weakness (generalized): Secondary | ICD-10-CM

## 2019-03-07 DIAGNOSIS — M25511 Pain in right shoulder: Secondary | ICD-10-CM

## 2019-03-07 DIAGNOSIS — R293 Abnormal posture: Secondary | ICD-10-CM | POA: Diagnosis not present

## 2019-03-07 DIAGNOSIS — M25611 Stiffness of right shoulder, not elsewhere classified: Secondary | ICD-10-CM

## 2019-03-11 ENCOUNTER — Encounter: Payer: Self-pay | Admitting: Physical Therapy

## 2019-03-11 ENCOUNTER — Ambulatory Visit (INDEPENDENT_AMBULATORY_CARE_PROVIDER_SITE_OTHER): Payer: BC Managed Care – PPO | Admitting: Physical Therapy

## 2019-03-11 ENCOUNTER — Other Ambulatory Visit: Payer: Self-pay

## 2019-03-11 DIAGNOSIS — M25611 Stiffness of right shoulder, not elsewhere classified: Secondary | ICD-10-CM

## 2019-03-11 DIAGNOSIS — M25511 Pain in right shoulder: Secondary | ICD-10-CM | POA: Diagnosis not present

## 2019-03-11 DIAGNOSIS — M6281 Muscle weakness (generalized): Secondary | ICD-10-CM

## 2019-03-11 DIAGNOSIS — R293 Abnormal posture: Secondary | ICD-10-CM | POA: Diagnosis not present

## 2019-03-11 NOTE — Therapy (Signed)
[facility] [address] [address] [place], [place], [address] Phone: [phone]   Fax:  [phone]  Physical Therapy Treatment  Patient Details  Name: Sandy SpiesDawn Pallas MRN: [id number] Date of Birth: 11/05/1970 Referring Provider (PT): Dr. Terrilee FilesZach Smith   Encounter Date: 03/07/2019  PT End of Session - 03/11/19 0831    Visit Number  8    Number of Visits  12    Date for PT Re-Evaluation  03/13/19    PT Start Time  1502    PT Stop Time  1544    PT Time Calculation (min)  42 min    Activity Tolerance  Patient tolerated treatment well    Behavior During Therapy  [facility] for tasks assessed/performed       History reviewed. No pertinent past medical history.  History reviewed. No pertinent surgical history.  There were no vitals filed for this visit.  Subjective Assessment - 03/11/19 0829    Subjective  Pt states increased soreness after PT visits for a couple days. She is having MRI, but not scheduled until 7/16.    Patient Stated Goals  Decreased pain, improved movement/reaching.    Currently in Pain?  Yes    Pain Score  4     Pain Location  Shoulder    Pain Orientation  Right    Pain Descriptors / Indicators  Aching;Tightness    Pain Type  Acute pain    Pain Onset  More than a month ago    Pain Frequency  Intermittent                       [facility] Adult PT Treatment/Exercise - 03/11/19 0001      Shoulder Exercises: Supine   Protraction  --    Horizontal ABduction  AROM;10 reps    External Rotation  AAROM;Right;20 reps   cane   External Rotation Limitations  with LTR to L    Internal Rotation  AAROM;20 reps    Internal Rotation Limitations  with LTR to R    Flexion  AAROM;Right;15 reps   cane   Flexion Limitations  --      Shoulder Exercises: Prone   Other Prone Exercises  prone arm hang with rotation x10;       Shoulder Exercises: Standing   External Rotation  15 reps;Theraband    Theraband Level (Shoulder  External Rotation)  Level 2 (Red)    Internal Rotation  Theraband;15 reps   Hands behind back, and with stick    Theraband Level (Shoulder Internal Rotation)  Level 2 (Red)    Flexion  AROM;10 reps    ABduction  AROM;10 reps    Row  Both;Theraband;20 reps    Theraband Level (Shoulder Row)  Level 3 (Green)    Other Standing Exercises  Post shoulder stretch 30 sec x3 on R;     Other Standing Exercises  Wall Slides 1 UE x15 each for flexion and circles;  wall push ups x12;       Shoulder Exercises: Pulleys   Flexion  2 minutes    Scaption  2 minutes      Shoulder Exercises: Stretch   Corner Stretch  20 seconds;3 reps    Corner Stretch Limitations  in doorway/90 and low    Internal Rotation Stretch  60 seconds    Internal Rotation Stretch Limitations  standing, arms behind back,     Other Shoulder Stretches  sleeper stretch for IR, 30 sec  x2;       Manual Therapy   Manual Therapy  Joint mobilization;Soft tissue mobilization;Passive ROM    Joint Mobilization  Rt shoulder grades 2-3 inf and A/P,  standing mulligan mobs for IR.    Passive ROM  R shoulder, all motions,  Subscap release,                   PT Long Term Goals - 01/30/19 1545      PT LONG TERM GOAL #1   Title  independent with HEP    Status  New    Target Date  03/13/19      PT LONG TERM GOAL #2   Title  Rt shoulder flexion and abduction AROM improved at least 15 degrees for improved function    Status  New    Target Date  03/13/19      PT LONG TERM GOAL #3   Title  report pain < 4/10 with activity and reaching for improved function    Status  New    Target Date  03/13/19      PT LONG TERM GOAL #4   Title  demonstrate Rt shoulder functional internal rotation to at least L1 for improved function and greater ease with dressing    Status  New    Target Date  03/13/19            Plan - 03/11/19 0832    Clinical Impression Statement  Pt showing mild improvments in ROM. She still has significant  limitation for all motions, and does have signfiicant pain with PROM in all motions. Guarding and pain is limiting for increasing motion. Discussed Dry needling with pt today, for improving surrounding muscle tightness and spasm. Reinforced frequent, gentile ROM for HEP.    Examination-Activity Limitations  Bathing;Hygiene/Grooming;Reach Overhead;Toileting;Dressing    Examination-Participation Restrictions  Cleaning;Meal Prep;Other   work   Merchant navy officer  Evolving/Moderate complexity    Rehab Potential  Good    PT Frequency  2x / week    PT Duration  6 weeks    PT Treatment/Interventions  ADLs/Self Care Home Management;Cryotherapy;Ultrasound;Moist Heat;Iontophoresis 4mg /ml Dexamethasone;Electrical Stimulation;Therapeutic exercise;Therapeutic activities;Patient/family education;Manual techniques;Dry needling;Passive range of motion;Taping    PT Home Exercise Plan  Access Code: [email or web address]    Consulted and Agree with Plan of Care  Patient       Patient will benefit from skilled therapeutic intervention in order to improve the following deficits and impairments:  Increased fascial restricitons, Pain, Postural dysfunction, Decreased strength, Decreased range of motion, Hypomobility  Visit Diagnosis: 1. Stiffness of right shoulder, not elsewhere classified   2. Acute pain of right shoulder   3. Abnormal posture   4. Muscle weakness (generalized)        Problem List Patient Active Problem List   Diagnosis Date Noted  . Frozen shoulder 12/13/2018  . Rotator cuff tear, right 11/15/2018   [name], PT, DPT 8:38 AM  03/11/19    [facility] [place], [place], [address] Phone: [phone]   Fax:  [phone]  Name: Sandy Jordan MRN: [id number] Date of Birth: May 20, 1971

## 2019-03-11 NOTE — Therapy (Signed)
[place] [address] [place], [place], [address] Phone: [phone]   Fax:  [phone]  Physical Therapy Treatment  Patient Details  Name: Sandy Jordan MRN: [id number] Date of Birth: [birth year] Referring Provider (PT): Dr. Charlann Boxer   Encounter Date: 03/11/2019  PT End of Session - 03/11/19 1358    Visit Number  9    Number of Visits  12    Date for PT Re-Evaluation  03/13/19    PT Start Time  5093    PT Stop Time  1359    PT Time Calculation (min)  56 min    Activity Tolerance  Patient tolerated treatment well    Behavior During Therapy  [facility] for tasks assessed/performed       History reviewed. No pertinent past medical history.  History reviewed. No pertinent surgical history.  There were no vitals filed for this visit.  Subjective Assessment - 03/11/19 1357    Subjective  Pt with less soreness today, has been doing HEP, still has most pain in AM.    Pain Score  4     Pain Location  Shoulder    Pain Orientation  Right    Pain Descriptors / Indicators  Tightness;Aching;Burning    Pain Type  Acute pain    Pain Onset  More than a month ago    Pain Frequency  Intermittent                       [facility] Adult PT Treatment/Exercise - 03/11/19 1306      Shoulder Exercises: Supine   Protraction  --    Horizontal ABduction  --    External Rotation  --   cane   External Rotation Limitations  --    Flexion  --   cane   Flexion Limitations  --      Shoulder Exercises: Standing   Internal Rotation  AAROM;Right;10 reps   Hands behind back, and with stick    Flexion  AROM;10 reps    ABduction  AROM;10 reps    Row  --    Theraband Level (Shoulder Row)  --    Other Standing Exercises  Post shoulder stretch 30 sec x3 on R;     Other Standing Exercises  Wall Slides 1 UE x15 each for flexion and scaption;  wall push ups x15      Shoulder Exercises: Pulleys   Flexion  2 minutes    Scaption  2 minutes       Shoulder Exercises: Stretch   Corner Stretch  20 seconds;3 reps    Corner Stretch Limitations  in doorway/90 and low      Modalities   Modalities  Moist Heat      Moist Heat Therapy   Number Minutes Moist Heat  10 Minutes    Moist Heat Location  Shoulder   R     Manual Therapy   Manual Therapy  Joint mobilization;Soft tissue mobilization;Passive ROM    Manual therapy comments  skilled palpation and monitoring and of soft tissue during dry needling.     Joint Mobilization  Rt shoulder grades 2-3 inf and A/P,     Passive ROM  R shoulder, all motions,  Subscap release,        Trigger Point Dry Needling - 03/11/19 0001    Consent Given?  Yes    Education Handout Provided  Yes    Muscles Treated Head and Neck  Upper  trapezius    Muscles Treated Upper Quadrant  Infraspinatus;Teres major;Teres minor    Upper Trapezius Response  Twitch reponse elicited;Palpable increased muscle length    Infraspinatus Response  Palpable increased muscle length    Teres major Response  Palpable increased muscle length    Teres minor Response  Palpable increased muscle length                PT Long Term Goals - 01/30/19 1545      PT LONG TERM GOAL #1   Title  independent with HEP    Status  New    Target Date  03/13/19      PT LONG TERM GOAL #2   Title  Rt shoulder flexion and abduction AROM improved at least 15 degrees for improved function    Status  New    Target Date  03/13/19      PT LONG TERM GOAL #3   Title  report pain < 4/10 with activity and reaching for improved function    Status  New    Target Date  03/13/19      PT LONG TERM GOAL #4   Title  demonstrate Rt shoulder functional internal rotation to at least L1 for improved function and greater ease with dressing    Status  New    Target Date  03/13/19            Plan - 03/11/19 1449    Clinical Impression Statement  Pt consent to dry needling today. Started with UT, and posterior shoulder, will do sub  scap and lat next week, in hopes to decrease muscle guarding, and improved ROM. Pt educated on self mob for inf glide. She continues to have much stiffness for inferior glide, making elevation difficult. Plan to progress as tolerated. Pt out of town for the rest of this week.    Examination-Activity Limitations  Bathing;Hygiene/Grooming;Reach Overhead;Toileting;Dressing    Examination-Participation Restrictions  Cleaning;Meal Prep;Other   work   Conservation officer, historic buildingstability/Clinical Decision Making  Evolving/Moderate complexity    Rehab Potential  Good    PT Frequency  2x / week    PT Duration  6 weeks    PT Treatment/Interventions  ADLs/Self Care Home Management;Cryotherapy;Ultrasound;Moist Heat;Iontophoresis 4mg /ml Dexamethasone;Electrical Stimulation;Therapeutic exercise;Therapeutic activities;Patient/family education;Manual techniques;Dry needling;Passive range of motion;Taping    PT Home Exercise Plan  Access Code: [email or web address]    Consulted and Agree with Plan of Care  Patient       Patient will benefit from skilled therapeutic intervention in order to improve the following deficits and impairments:  Increased fascial restricitons, Pain, Postural dysfunction, Decreased strength, Decreased range of motion, Hypomobility  Visit Diagnosis: 1. Stiffness of right shoulder, not elsewhere classified   2. Acute pain of right shoulder   3. Abnormal posture   4. Muscle weakness (generalized)        Problem List Patient Active Problem List   Diagnosis Date Noted  . Frozen shoulder 12/13/2018  . Rotator cuff tear, right 11/15/2018    Sedalia MutaLauren Hazle Ogburn, PT, DPT 2:51 PM  03/11/19    [facility] [address] [place], [place], [address] Phone: [phone]   Fax:  [phone]  Name: Sandy Jordan MRN: [id number] Date of Birth: 05/06/1971

## 2019-03-18 ENCOUNTER — Other Ambulatory Visit: Payer: Self-pay

## 2019-03-18 ENCOUNTER — Ambulatory Visit (INDEPENDENT_AMBULATORY_CARE_PROVIDER_SITE_OTHER): Payer: BC Managed Care – PPO | Admitting: Physical Therapy

## 2019-03-18 ENCOUNTER — Encounter: Payer: Self-pay | Admitting: Physical Therapy

## 2019-03-18 DIAGNOSIS — M25611 Stiffness of right shoulder, not elsewhere classified: Secondary | ICD-10-CM | POA: Diagnosis not present

## 2019-03-18 DIAGNOSIS — M25511 Pain in right shoulder: Secondary | ICD-10-CM | POA: Diagnosis not present

## 2019-03-18 DIAGNOSIS — R293 Abnormal posture: Secondary | ICD-10-CM | POA: Diagnosis not present

## 2019-03-18 DIAGNOSIS — M6281 Muscle weakness (generalized): Secondary | ICD-10-CM

## 2019-03-18 NOTE — Therapy (Addendum)
[place] [address] [place], [place], [address] Phone: [phone]   Fax:  [phone]  Physical Therapy Treatment/Re-Cert   Patient Details  Name: Sandy Jordan MRN: [id number] Date of Birth: 1971/06/06 Referring Provider (PT): Dr. Charlann Boxer   Encounter Date: 03/18/2019  PT End of Session - 03/18/19 1511    Visit Number  10    Number of Visits  12    Date for PT Re-Evaluation  04/29/19    PT Start Time  4268    PT Stop Time  1559    PT Time Calculation (min)  53 min    Activity Tolerance  Patient tolerated treatment well    Behavior During Therapy  [facility] for tasks assessed/performed       History reviewed. No pertinent past medical history.  History reviewed. No pertinent surgical history.  There were no vitals filed for this visit.  Subjective Assessment - 03/18/19 1511    Subjective  no new complaints today.    Currently in Pain?  Yes    Pain Score  4     Pain Location  Shoulder    Pain Orientation  Right    Pain Descriptors / Indicators  Aching    Pain Type  Acute pain    Pain Onset  More than a month ago    Pain Frequency  Intermittent         [facility] PT Assessment - 03/18/19 0001      PROM   Right Shoulder Flexion  135 Degrees    Right Shoulder Internal Rotation  35 Degrees    Right Shoulder External Rotation  35 Degrees      Strength   Overall Strength Comments  all 3-/5, unable to lift aganist gravity for full ROM, due to stiffness, 4-/5 tested at neutral.                   [facility] Adult PT Treatment/Exercise - 03/18/19 1514      Shoulder Exercises: Supine   External Rotation  --   cane   Flexion  AAROM;15 reps   cane     Shoulder Exercises: Standing   Internal Rotation  AAROM;Right;10 reps   Hands behind back, and with stick    Flexion  AROM;10 reps    Flexion Limitations  AAROM cane x10     ABduction  AROM;10 reps    ABduction Limitations  AAROM cane x10;     Row   Both;Theraband;20 reps    Theraband Level (Shoulder Row)  Level 3 (Green)    Other Standing Exercises  --    Other Standing Exercises  --      Shoulder Exercises: Pulleys   Flexion  2 minutes    Scaption  2 minutes      Shoulder Exercises: Stretch   Corner Stretch  --    Warehouse manager Limitations  --      Modalities   Modalities  Moist Heat      Moist Heat Therapy   Moist Heat Location  --      Manual Therapy   Manual Therapy  Joint mobilization;Soft tissue mobilization;Passive ROM    Manual therapy comments  skilled palpation and monitoring and of soft tissue during dry needling.     Joint Mobilization  Rt shoulder grades 2-3 inf and A/P,     Passive ROM  R shoulder, all motions,  Subscap release,        Trigger Point Dry Needling -  03/18/19 0001    Consent Given?  Yes    Education Handout Provided  Previously provided    Muscles Treated Upper Quadrant  Infraspinatus;Deltoid;Latissimus dorsi;Teres major;Teres minor    Infraspinatus Response  Palpable increased muscle length    Deltoid Response  Twitch response elicited;Palpable increased muscle length    Latissimus dorsi Response  Twitch response elicited;Palpable increased muscle length    Teres major Response  Palpable increased muscle length    Teres minor Response  Palpable increased muscle length           PT Education - 03/18/19 1600    Education Details  Discussed progress, HEP reviewed    Person(s) Educated  Patient    Methods  Explanation    Comprehension  Verbalized understanding          PT Long Term Goals - 03/18/19 1600      PT LONG TERM GOAL #1   Title  independent with HEP    Status  Achieved      PT LONG TERM GOAL #2   Title  Rt shoulder flexion and abduction AROM improved at least 15 degrees for improved function    Status  Partially Met    Target Date  04/29/19      PT LONG TERM GOAL #3   Title  report pain < 4/10 with activity and reaching for improved function    Status   On-going    Target Date  04/29/19      PT LONG TERM GOAL #4   Title  demonstrate Rt shoulder functional internal rotation to at least L1 for improved function and greater ease with dressing    Status  On-going    Target Date  04/29/19            Plan - 03/18/19 1602    Clinical Impression Statement  Pt has been seen for 10 visits. She has shown improvements with PROM, AROM. She has had some reduction in pain, but continues to have pain, daily,that is bothersome. She also continues to have signifiant joint stiffness that is limiting full PROM and ability for AROM. Pt has been diligent with HEP. Manual therapy, dry needling, and ther ex, all with focus on improving ROM. Pt to benefit from continued care, to improve ability for joint ROM, active elevation, and improved function.    Examination-Activity Limitations  Bathing;Hygiene/Grooming;Reach Overhead;Toileting;Dressing    Examination-Participation Restrictions  Cleaning;Meal Prep;Other   work   Merchant navy officer  Evolving/Moderate complexity    Rehab Potential  Good    PT Frequency  2x / week    PT Duration  6 weeks    PT Treatment/Interventions  ADLs/Self Care Home Management;Cryotherapy;Ultrasound;Moist Heat;Iontophoresis 91m/ml Dexamethasone;Electrical Stimulation;Therapeutic exercise;Therapeutic activities;Patient/family education;Manual techniques;Dry needling;Passive range of motion;Taping    PT Home Exercise Plan  Access Code: [email or web address]   Consulted and Agree with Plan of Care  Patient       Patient will benefit from skilled therapeutic intervention in order to improve the following deficits and impairments:  Increased fascial restricitons, Pain, Postural dysfunction, Decreased strength, Decreased range of motion, Hypomobility  Visit Diagnosis: 1. Stiffness of right shoulder, not elsewhere classified   2. Acute pain of right shoulder   3. Abnormal posture   4. Muscle weakness (generalized)         Problem List Patient Active Problem List   Diagnosis Date Noted  . Frozen shoulder 12/13/2018  . Rotator cuff tear, right 11/15/2018    LLyndee Hensen PT,  DPT 4:04 PM  03/18/19    [facility] [place], [place], [address] Phone: [phone]   Fax:  [phone]  Name: Sandy Jordan MRN: [id number] Date of Birth: 1971/06/28   PHYSICAL THERAPY DISCHARGE SUMMARY  Visits from Start of Care:10  Plan: Patient agrees to discharge.  Patient goals were partially met. Patient is being discharged due to not returning since the last visit.  ?????     Recommended pt continue PT, she did not return after last visit.   [name], PT, DPT 12:03 PM  05/27/19

## 2019-03-21 ENCOUNTER — Encounter: Payer: BC Managed Care – PPO | Admitting: Physical Therapy

## 2019-03-25 ENCOUNTER — Telehealth: Payer: Self-pay | Admitting: General Practice

## 2019-03-25 NOTE — Telephone Encounter (Signed)
See note

## 2019-03-25 NOTE — Telephone Encounter (Signed)
Patient is calling to reschedule her PT appt with Lauren for Thursday or Monday Morning. Please advise [phone]

## 2019-03-25 NOTE — Telephone Encounter (Signed)
Called pt to resch appt. No answer, LVM.

## 2019-03-26 ENCOUNTER — Encounter: Payer: BC Managed Care – PPO | Admitting: Physical Therapy

## 2019-03-28 ENCOUNTER — Ambulatory Visit
Admission: RE | Admit: 2019-03-28 | Discharge: 2019-03-28 | Disposition: A | Discharge status: Home / Self Care | Payer: BC Managed Care – PPO | Source: Ambulatory Visit | Attending: Family Medicine | Admitting: Family Medicine

## 2019-03-28 ENCOUNTER — Other Ambulatory Visit: Payer: Self-pay

## 2019-03-28 DIAGNOSIS — M25511 Pain in right shoulder: Secondary | ICD-10-CM | POA: Diagnosis not present

## 2019-03-28 DIAGNOSIS — G8929 Other chronic pain: Secondary | ICD-10-CM

## 2019-03-28 DIAGNOSIS — M7591 Shoulder lesion, unspecified, right shoulder: Secondary | ICD-10-CM | POA: Diagnosis not present

## 2019-03-28 MED ORDER — IOPAMIDOL (ISOVUE-M 200) INJECTION 41%
8.0000 mL | Freq: Once | INTRAMUSCULAR | Status: AC
  Administered 2019-03-28: 16:00:00 8 mL via INTRA_ARTICULAR

## 2021-02-16 IMAGING — XA FLUORO GUIDED NEEDLE PLACEMENT AND/OR ASPIRATION
2 series · 5 of 5 positions shown · non-contrast
Comparison: none

CLINICAL DATA: Chronic right shoulder pain.

[Series 2: ortho standard · 1 of 1 slices shown (1 of 2)]
[im 1/1]
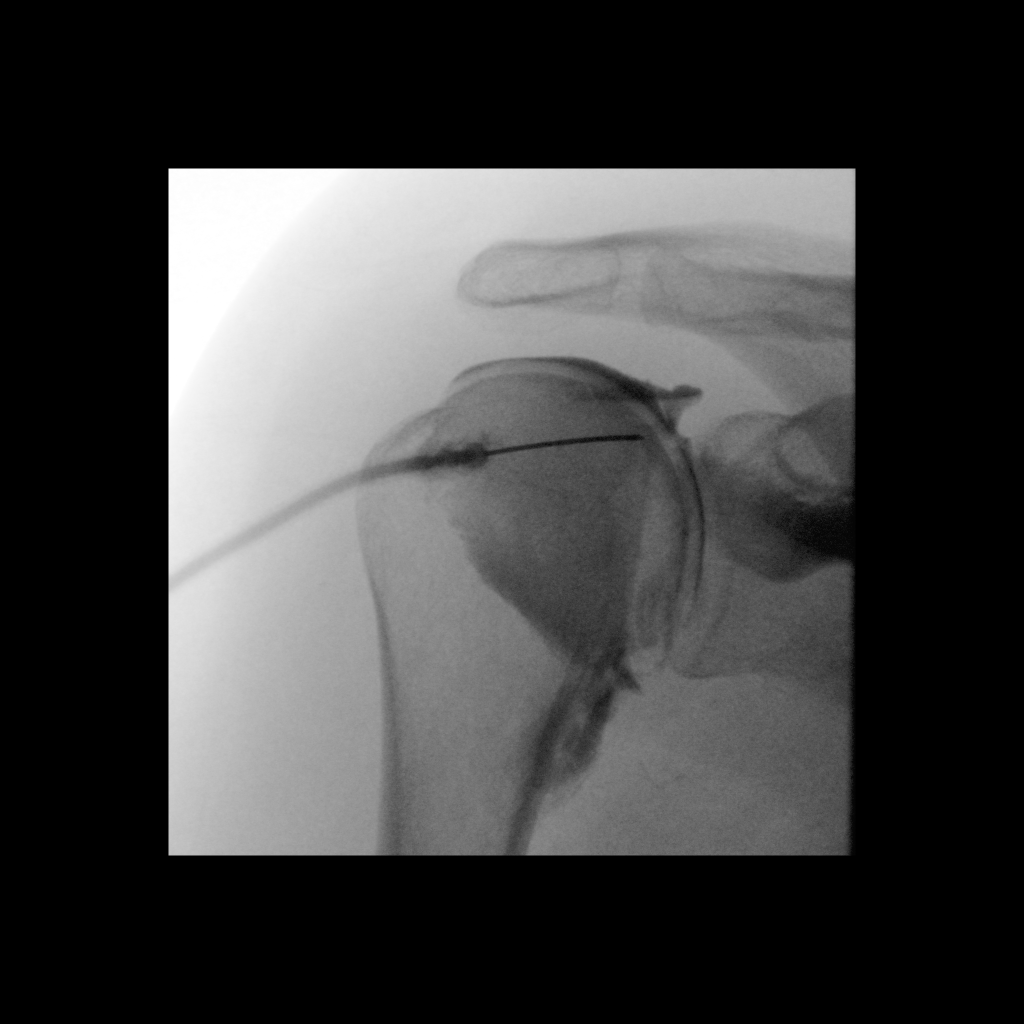

[Series 3: ortho standard · 2 acquisitions, 4 frames shown (2 of 2)]
[im 1/2]
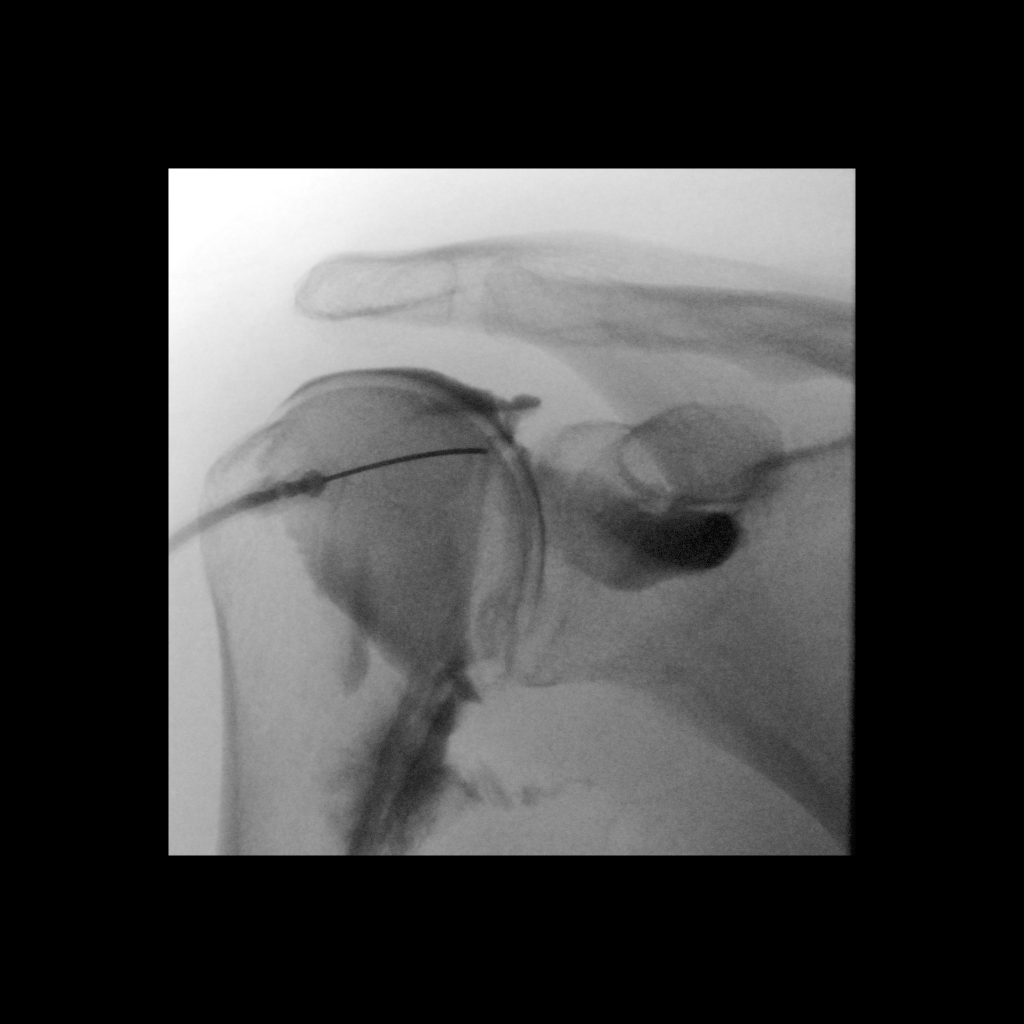
[im 2/2]
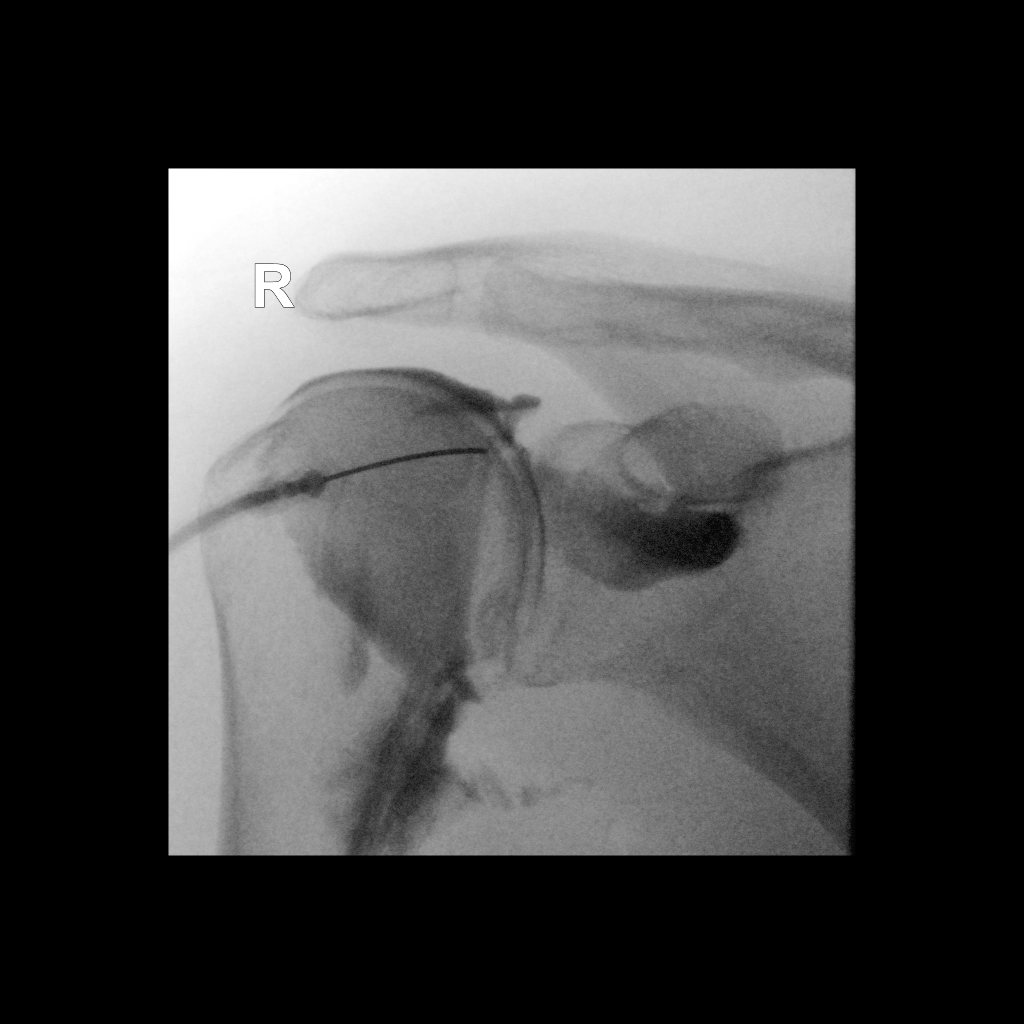
[im 2/2]
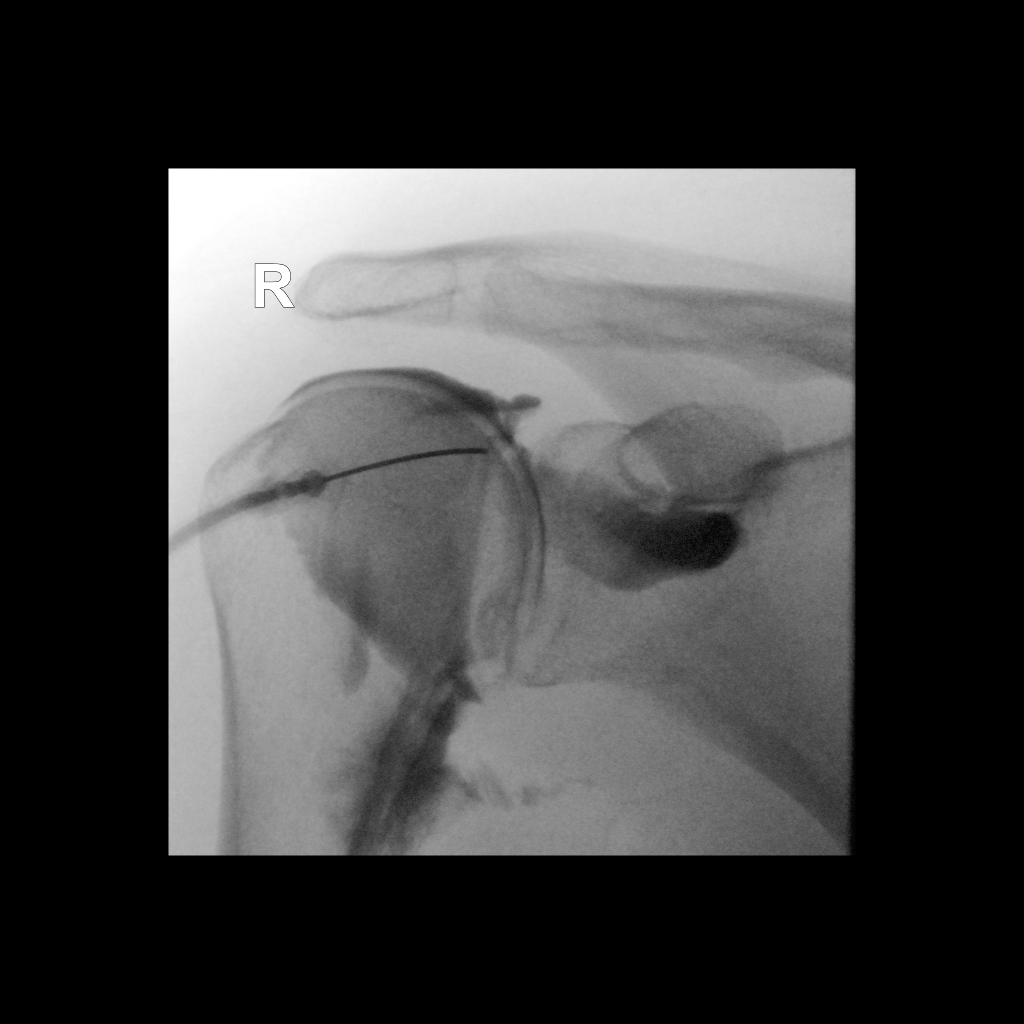
[im 2/2]
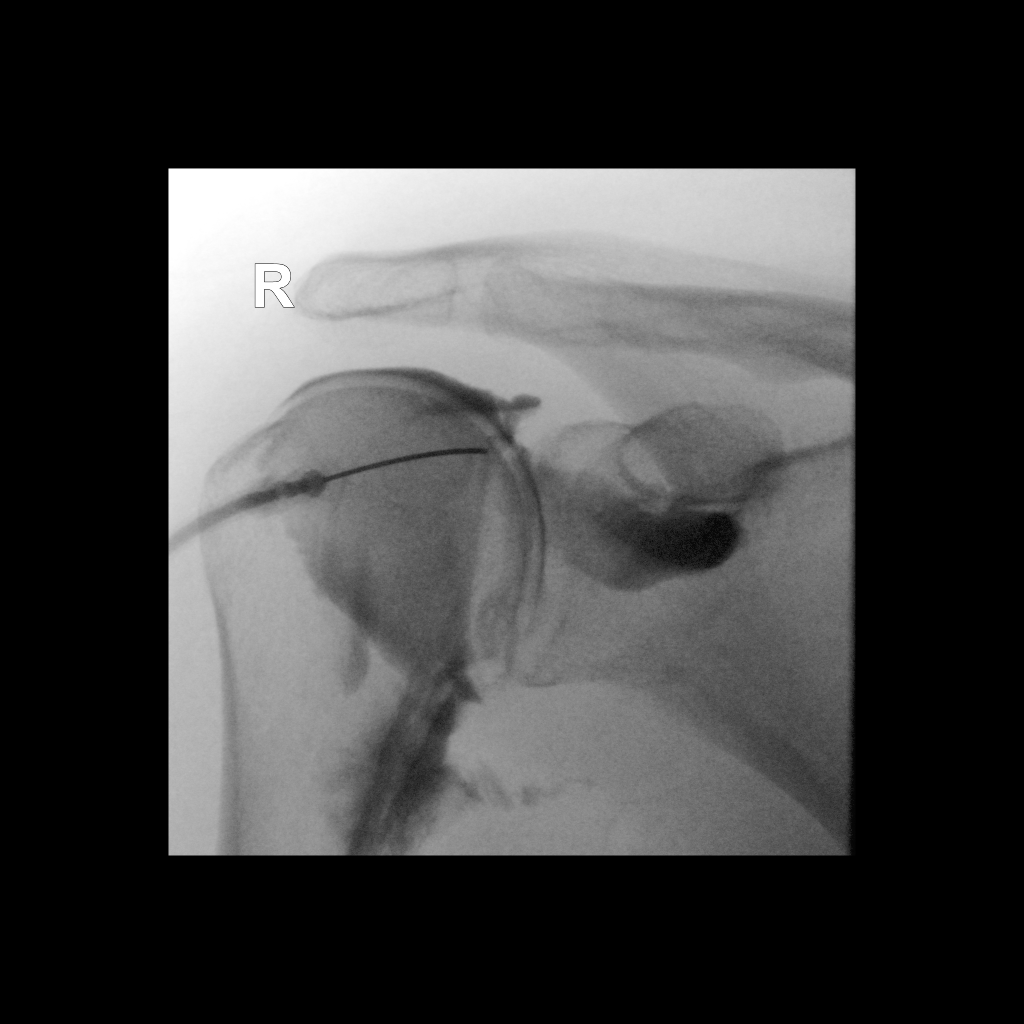

[5 of 5 positions shown; findings below may reference images not displayed]

FLUOROSCOPY TIME:  Radiation Exposure Index (as provided by the
fluoroscopic device): 0.7 mGy

Fluoroscopy Time:  2 seconds

Number of Acquired Images:  0

PROCEDURE:
The risks and benefits of the procedure were discussed with the
patient, and written informed consent was obtained. The patient
stated no history of allergy to contrast media. A formal timeout
procedure was performed with the patient according to departmental
protocol.

The patient was placed supine on the fluoroscopy table and the right
glenohumeral joint was identified under fluoroscopy. The skin
overlying the right glenohumeral joint was subsequently cleaned with
Betadine and a sterile drape was placed over the area of interest. 2
ml 1% Lidocaine was used to anesthetize the skin around the needle
insertion site.

A 22 gauge spinal needle was inserted into the right glenohumeral
joint under fluoroscopy.

8 ml of gadolinium mixture (0.1 ml of Multihance mixed with 10 ml of
Isovue-M 200 contrast and 10 ml of sterile saline) were injected
into the right glenohumeral joint. There was significant resistance
to injection with reduced joint capacity and extravasation of
contrast along the axillary pouch.

The needle was removed and hemostasis was achieved. The patient was
subsequently transferred to MRI for imaging.
IMPRESSION: 1. Technically successful right shoulder injection for MRI.
2. Findings suggestive of adhesive capsulitis.

## 2021-02-16 IMAGING — MR MRI OF THE RIGHT SHOULDER WITH CONTRAST
5 series · 40 of 40 positions shown · IV contrast (agent unspecified)
Comparison: Right shoulder x-rays dated January 24, 2019.

CLINICAL DATA: Chronic right shoulder pain and reduced range of
motion the past 6-7 months.

EXAM:
MR ARTHROGRAM OF THE RIGHT SHOULDER
TECHNIQUE: Multiplanar, multisequence MR imaging of the right shoulder was
performed following the administration of intra-articular contrast.
CONTRAST:  See Injection Documentation.

[Series 3: T1 fat-sat · axial · 4.0mm · 0.27mm/px · z∈[-45,+39]mm · 9 of 18 slices shown (1 of 3)]
[im 1/18]
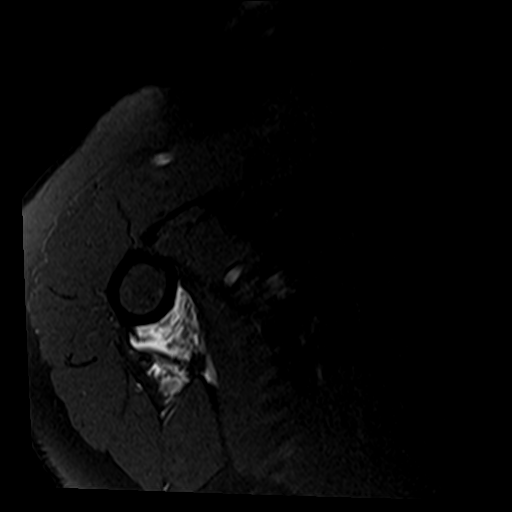
[im 3/18]
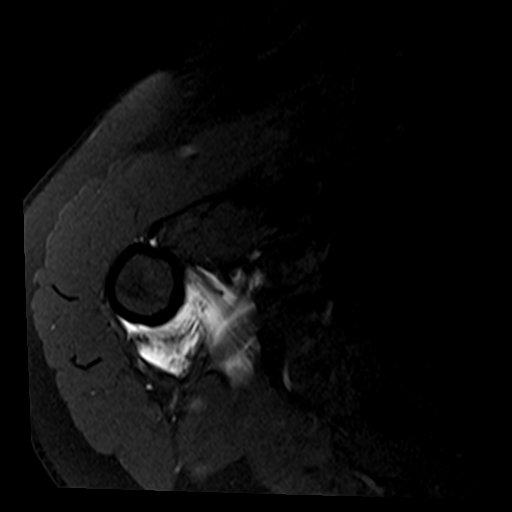
[im 5/18]
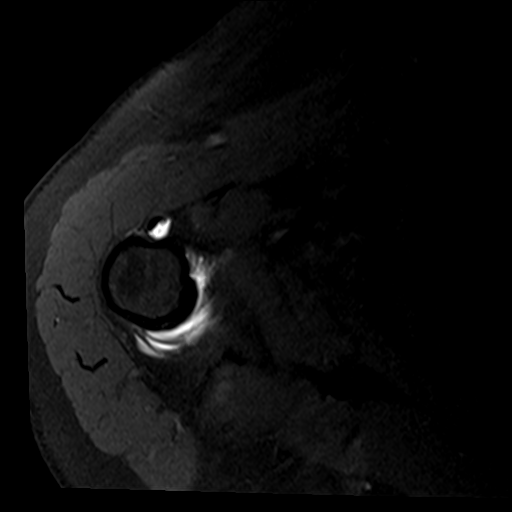
[im 7/18]
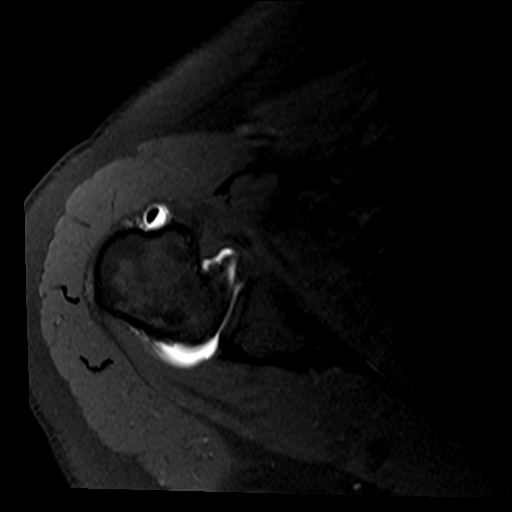
[im 9/18]
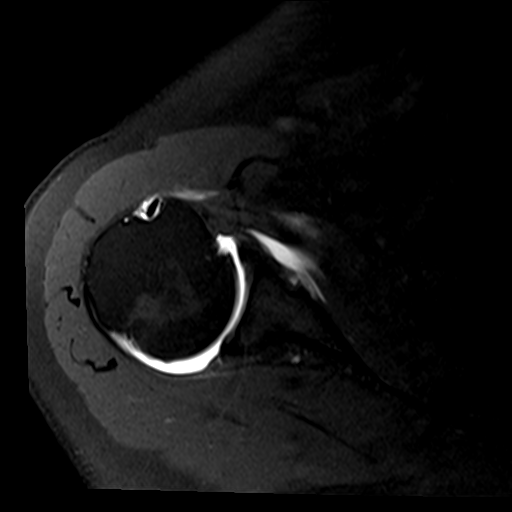
[im 11/18]
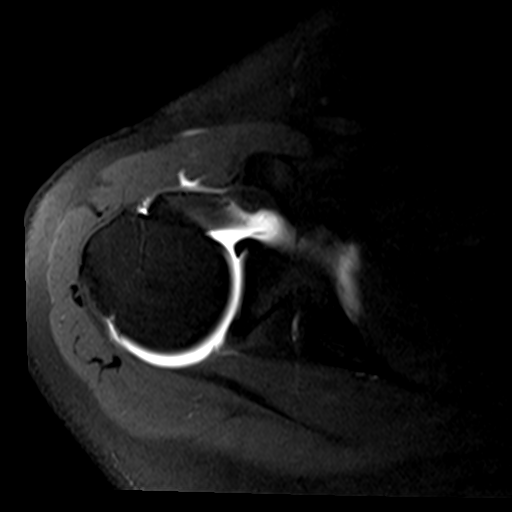
[im 13/18]
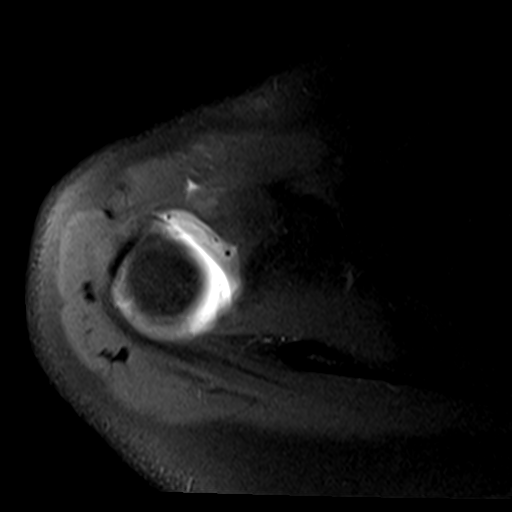
[im 15/18]
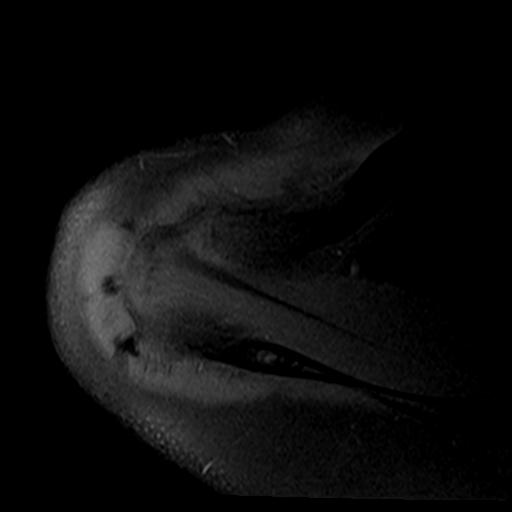
[im 18/18]
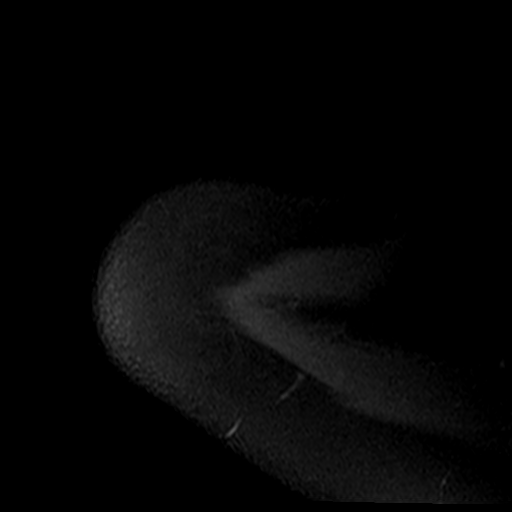

[Series 4: T2 fat-sat · oblique · 4.0mm · 0.55mm/px · 10 of 20 slices shown (1 of 2)]
[im 1/20]
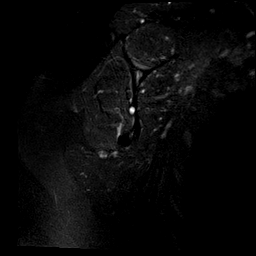
[im 3/20]
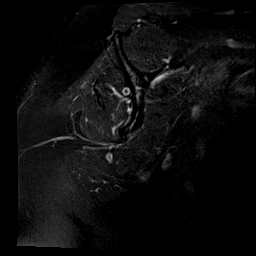
[im 5/20]
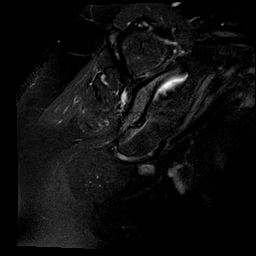
[im 7/20]
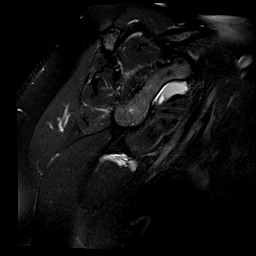
[im 9/20]
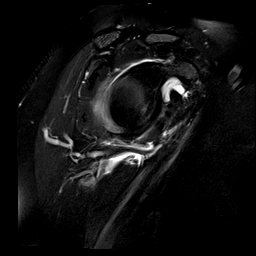
[im 11/20]
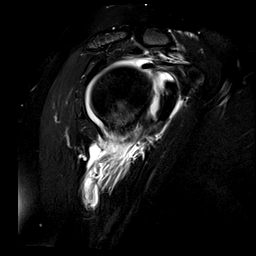
[im 13/20]
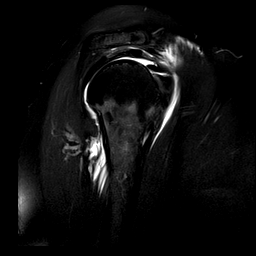
[im 15/20]
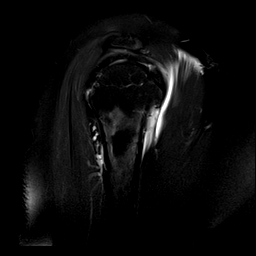
[im 17/20]
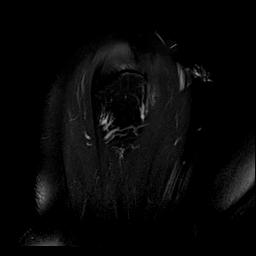
[im 20/20]
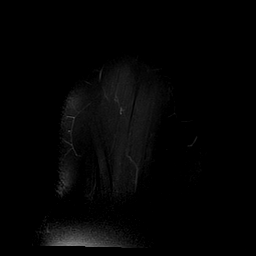

[Series 5: T1 fat-sat · oblique · 4.0mm · 0.55mm/px · 7 of 16 slices shown (2 of 3)]
[im 1/16]
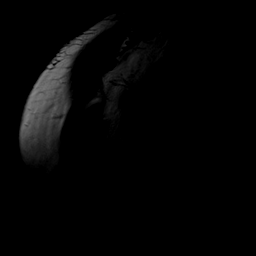
[im 3/16]
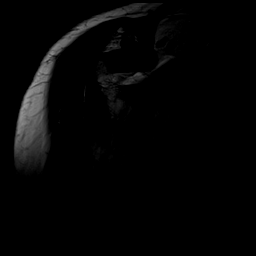
[im 6/16]
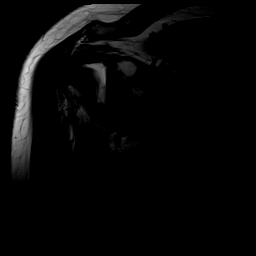
[im 8/16]
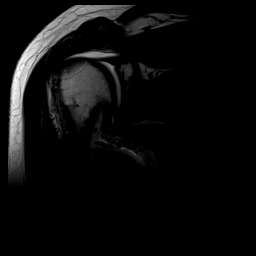
[im 11/16]
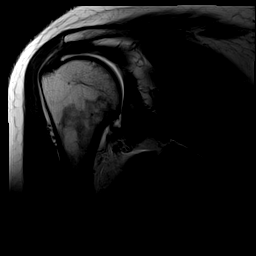
[im 13/16]
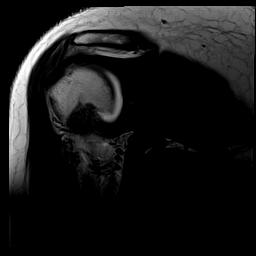
[im 16/16]
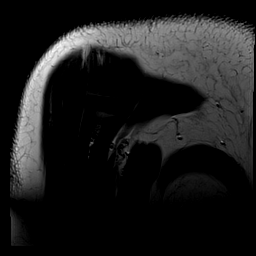

[Series 6: T1 fat-sat · oblique · 4.0mm · 0.55mm/px · 7 of 16 slices shown (3 of 3)]
[im 1/16]
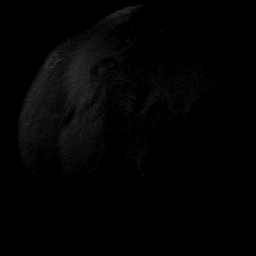
[im 3/16]
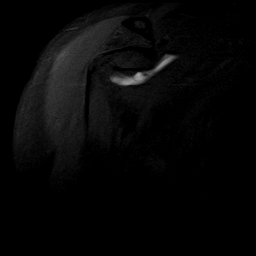
[im 6/16]
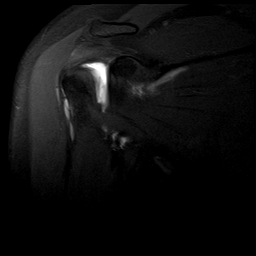
[im 8/16]
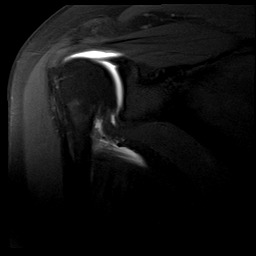
[im 11/16]
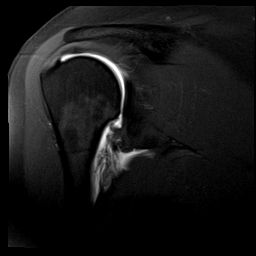
[im 13/16]
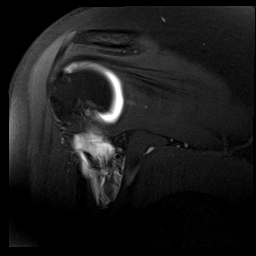
[im 16/16]
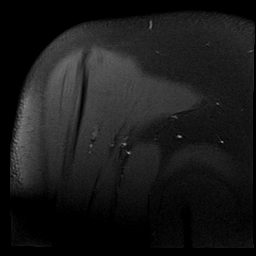

[Series 7: T2 fat-sat · oblique · 4.0mm · 0.55mm/px · 7 of 16 slices shown (2 of 2)]
[im 1/16]
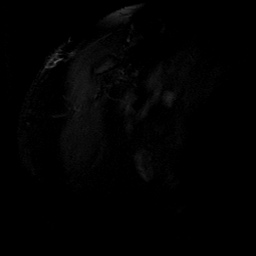
[im 3/16]
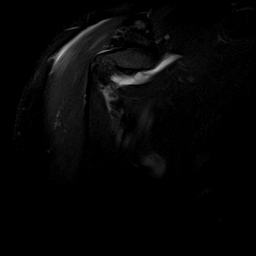
[im 6/16]
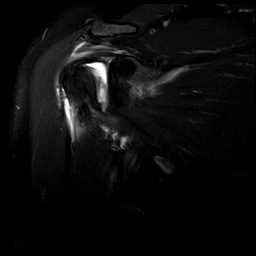
[im 8/16]
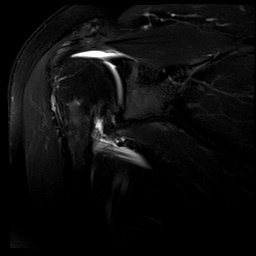
[im 11/16]
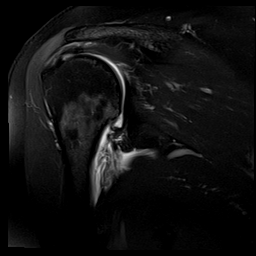
[im 13/16]
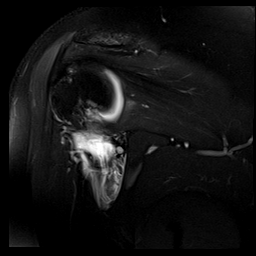
[im 16/16]
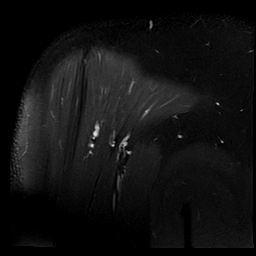

[40 of 40 positions shown; findings below may reference images not displayed]

FINDINGS: Rotator cuff: Intact rotator cuff. Mild supraspinatus tendinosis
with some distal bursal surface fraying.

Muscles:  No focal muscular atrophy or edema.

Biceps long head:  Intact and normally positioned.

Acromioclavicular Joint: The acromion is type I. Normal
acromioclavicular joint. Trace fluid but no contrast within the
subacromial/subdeltoid bursa.

Glenohumeral Joint: Distended with intra-articular contrast. No
chondral defect.

Labrum:  No evidence of labral tear.

Bones: No acute or significant extra-articular osseous findings.

Other: Extravasated contrast along the axillary pouch.
IMPRESSION: 1. Mild supraspinatus tendinosis with distal bursal surface fraying.
No rotator cuff tear.
2. No labral tear.
